# Patient Record
Sex: Female | Born: 1960 | Race: White | Hispanic: No | Marital: Married
Health system: Midwestern US, Academic
[De-identification: ages and names within clinical notes are randomized; demographics above are authoritative.]

---

## 2017-11-30 ENCOUNTER — Encounter: Admit: 2017-11-30 | Discharge: 2017-11-30 | Payer: MEDICARE

## 2017-11-30 DIAGNOSIS — N3020 Other chronic cystitis without hematuria: ICD-10-CM

## 2017-11-30 DIAGNOSIS — E2740 Unspecified adrenocortical insufficiency: ICD-10-CM

## 2017-11-30 DIAGNOSIS — R5383 Other fatigue: ICD-10-CM

## 2017-11-30 DIAGNOSIS — F329 Major depressive disorder, single episode, unspecified: ICD-10-CM

## 2017-11-30 DIAGNOSIS — E039 Hypothyroidism, unspecified: ICD-10-CM

## 2017-11-30 DIAGNOSIS — G43909 Migraine, unspecified, not intractable, without status migrainosus: ICD-10-CM

## 2017-11-30 DIAGNOSIS — F419 Anxiety disorder, unspecified: ICD-10-CM

## 2017-11-30 DIAGNOSIS — R000 Tachycardia, unspecified: ICD-10-CM

## 2017-11-30 DIAGNOSIS — F502 Bulimia nervosa: ICD-10-CM

## 2017-11-30 DIAGNOSIS — IMO0002 Degenerative disc disease: ICD-10-CM

## 2017-11-30 DIAGNOSIS — R51 Headache: ICD-10-CM

## 2017-11-30 DIAGNOSIS — K319 Disease of stomach and duodenum, unspecified: ICD-10-CM

## 2017-11-30 DIAGNOSIS — G4730 Sleep apnea, unspecified: ICD-10-CM

## 2017-11-30 DIAGNOSIS — F319 Bipolar disorder, unspecified: Principal | ICD-10-CM

## 2017-12-11 ENCOUNTER — Encounter: Admit: 2017-12-11 | Discharge: 2017-12-11 | Payer: MEDICARE

## 2017-12-12 MED ORDER — LEVOTHYROXINE 100 MCG PO TAB
ORAL_TABLET | Freq: Every day | 0 refills
Start: 2017-12-12 — End: ?

## 2018-04-09 ENCOUNTER — Encounter: Admit: 2018-04-09 | Discharge: 2018-04-09 | Payer: MEDICARE

## 2018-12-03 ENCOUNTER — Encounter: Admit: 2018-12-03 | Discharge: 2018-12-03 | Payer: MEDICARE
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
## 2019-01-04 ENCOUNTER — Encounter: Admit: 2019-01-04 | Discharge: 2019-01-04 | Payer: MEDICARE

## 2019-01-04 ENCOUNTER — Inpatient Hospital Stay
Admit: 2019-01-04 | Discharge: 2019-01-07 | Disposition: A | Discharge status: Home / Self Care | Payer: MEDICARE | Attending: Psychiatry | Admitting: Psychiatry

## 2019-01-04 DIAGNOSIS — R51 Headache: ICD-10-CM

## 2019-01-04 DIAGNOSIS — R000 Tachycardia, unspecified: ICD-10-CM

## 2019-01-04 DIAGNOSIS — F411 Generalized anxiety disorder: ICD-10-CM

## 2019-01-04 DIAGNOSIS — I2699 Other pulmonary embolism without acute cor pulmonale: ICD-10-CM

## 2019-01-04 DIAGNOSIS — E2740 Unspecified adrenocortical insufficiency: ICD-10-CM

## 2019-01-04 DIAGNOSIS — E039 Hypothyroidism, unspecified: ICD-10-CM

## 2019-01-04 DIAGNOSIS — G4730 Sleep apnea, unspecified: ICD-10-CM

## 2019-01-04 DIAGNOSIS — F502 Bulimia nervosa: ICD-10-CM

## 2019-01-04 DIAGNOSIS — K319 Disease of stomach and duodenum, unspecified: ICD-10-CM

## 2019-01-04 DIAGNOSIS — R5383 Other fatigue: ICD-10-CM

## 2019-01-04 DIAGNOSIS — IMO0002 Degenerative disc disease: ICD-10-CM

## 2019-01-04 DIAGNOSIS — G43909 Migraine, unspecified, not intractable, without status migrainosus: ICD-10-CM

## 2019-01-04 DIAGNOSIS — F329 Major depressive disorder, single episode, unspecified: ICD-10-CM

## 2019-01-04 DIAGNOSIS — F319 Bipolar disorder, unspecified: Principal | ICD-10-CM

## 2019-01-04 DIAGNOSIS — N3020 Other chronic cystitis without hematuria: ICD-10-CM

## 2019-01-04 DIAGNOSIS — F419 Anxiety disorder, unspecified: ICD-10-CM

## 2019-01-04 DIAGNOSIS — F314 Bipolar disorder, current episode depressed, severe, without psychotic features: ICD-10-CM

## 2019-01-04 LAB — CBC AND DIFF
Lab: 0 10*3/uL (ref 0–0.20)
Lab: 0.1 10*3/uL (ref 0–0.45)
Lab: 8.7 K/UL — ABNORMAL HIGH (ref 4.5–11.0)

## 2019-01-04 LAB — SALICYLATE LEVEL

## 2019-01-04 LAB — COMPREHENSIVE METABOLIC PANEL
Lab: 141 MMOL/L — ABNORMAL LOW (ref 137–147)
Lab: 38 U/L (ref 7–56)
Lab: 4.1 MMOL/L — ABNORMAL HIGH (ref 3.5–5.1)
Lab: 9 K/UL (ref 3–12)

## 2019-01-04 LAB — ALCOHOL LEVEL: Lab: <10 mg/dL (ref 70–100)

## 2019-01-04 LAB — ACETAMINOPHEN LEVEL: Lab: <10 ug/mL (ref <20.1)

## 2019-01-04 MED ORDER — MAGNESIUM SULFATE IN D5W 1 GRAM/100 ML IV PGBK
1 g | Freq: Once | INTRAVENOUS | 0 refills | Status: DC
Start: 2019-01-04 — End: 2019-01-05

## 2019-01-04 MED ORDER — ACETAMINOPHEN 500 MG PO TAB
1000 mg | Freq: Once | ORAL | 0 refills | Status: CP
Start: 2019-01-04 — End: ?
  Administered 2019-01-04: 1000 mg via ORAL

## 2019-01-04 MED ORDER — PSYLLIUM HUSK (WITH SUGAR) 3.4 GRAM PO PWPK
1 | Freq: Once | ORAL | 0 refills | Status: CP
Start: 2019-01-04 — End: ?
  Administered 2019-01-05: 06:00:00 3.4 g via ORAL

## 2019-01-04 MED ORDER — LACTATED RINGERS IV SOLP
1000 mL | INTRAVENOUS | 0 refills | Status: DC
Start: 2019-01-04 — End: 2019-01-05

## 2019-01-04 NOTE — Progress Notes
No Vitals per pt . Pt consented to video visit. Pt verified name and DOB. Pt stated she understands all the documents that were sent to her and has no questions at this time.

## 2019-01-04 NOTE — Patient Instructions
Please take your medicines as prescribed. If you have questions about your medications or treatment, please call the clinic during regular business hours. You may also call the pharmacy directly for refills.    In case of emergency, for example if you develop suicidal intents or homicidal intents, call 911.     Therapy:  Online:  -https://www.betterhelp.com  -https://www.talkspace.com    In-person:  -Call the number on the back of your insurance card to ask for a list of in-network providers  -[facility] (request a psychotherapy packet)  -[facility]  -Depending on your county of residence: [facility], [facility], [facility] [place], [facility], or [facility].  -[facility]  -visit the Psychology Today website and enter your ZIP code to find independently practicing or group practicing licensed clinical social workers or psychologists.    Intensive Outpatient Programs (sleep at home, but have intensive therapy a few times per week):  -[facility] [phone]  -[facility] [phone]    Managing Fears and Anxiety around Coronavirus:  Common reactions to uncertainty about Coronavirus range widely, but may include anxiety/worry, feelings of helplessness, social withdrawal, difficulty concentrating and sleeping, anger, or hyper-vigilance to your health and body.  To manage fears and anxiety, it is recommended that you:    Download the Covid Coach: https://www.mobile.va.gov/app/covid-coach      1. Stay informed using the CDC website at https://www.cdc.gov/coronavirus/      2. Limit worry by lessening the time spent watching or listening to upsetting media coverage.  Remember to take breaks from the news and focus upon things that are positive in your life and that you have control over.     3. Be mindful of your assumptions about others.  If someone coughs or has

## 2019-01-05 ENCOUNTER — Encounter: Admit: 2019-01-05 | Discharge: 2019-01-05 | Payer: MEDICARE

## 2019-01-05 DIAGNOSIS — F314 Bipolar disorder, current episode depressed, severe, without psychotic features: ICD-10-CM

## 2019-01-05 DIAGNOSIS — R51 Headache: ICD-10-CM

## 2019-01-05 DIAGNOSIS — F329 Major depressive disorder, single episode, unspecified: ICD-10-CM

## 2019-01-05 DIAGNOSIS — F419 Anxiety disorder, unspecified: ICD-10-CM

## 2019-01-05 DIAGNOSIS — E039 Hypothyroidism, unspecified: ICD-10-CM

## 2019-01-05 DIAGNOSIS — G4730 Sleep apnea, unspecified: ICD-10-CM

## 2019-01-05 DIAGNOSIS — R911 Solitary pulmonary nodule: ICD-10-CM

## 2019-01-05 DIAGNOSIS — N3020 Other chronic cystitis without hematuria: ICD-10-CM

## 2019-01-05 DIAGNOSIS — F502 Bulimia nervosa: ICD-10-CM

## 2019-01-05 DIAGNOSIS — G629 Polyneuropathy, unspecified: ICD-10-CM

## 2019-01-05 DIAGNOSIS — IMO0002 Degenerative disc disease: ICD-10-CM

## 2019-01-05 DIAGNOSIS — G43909 Migraine, unspecified, not intractable, without status migrainosus: ICD-10-CM

## 2019-01-05 DIAGNOSIS — M1990 Unspecified osteoarthritis, unspecified site: ICD-10-CM

## 2019-01-05 DIAGNOSIS — E2740 Unspecified adrenocortical insufficiency: ICD-10-CM

## 2019-01-05 DIAGNOSIS — I2699 Other pulmonary embolism without acute cor pulmonale: ICD-10-CM

## 2019-01-05 DIAGNOSIS — F411 Generalized anxiety disorder: ICD-10-CM

## 2019-01-05 DIAGNOSIS — K319 Disease of stomach and duodenum, unspecified: ICD-10-CM

## 2019-01-05 DIAGNOSIS — G2401 Drug induced subacute dyskinesia: ICD-10-CM

## 2019-01-05 DIAGNOSIS — F319 Bipolar disorder, unspecified: Principal | ICD-10-CM

## 2019-01-05 DIAGNOSIS — R000 Tachycardia, unspecified: ICD-10-CM

## 2019-01-05 DIAGNOSIS — R5383 Other fatigue: ICD-10-CM

## 2019-01-05 LAB — COCAINE-URINE RANDOM: Lab: NEGATIVE mg/dL (ref 0.3–1.2)

## 2019-01-05 LAB — CANNABINOIDS-URINE RANDOM: Lab: NEGATIVE g/dL — ABNORMAL HIGH (ref 6.0–8.0)

## 2019-01-05 LAB — AMPHETAMINES-URINE RANDOM: Lab: NEGATIVE mg/dL (ref 7–25)

## 2019-01-05 LAB — BENZODIAZEPINES-URINE RANDOM: Lab: NEGATIVE mg/dL (ref 8.5–10.6)

## 2019-01-05 LAB — BARBITURATES-URINE RANDOM: Lab: NEGATIVE mg/dL (ref 0.4–1.00)

## 2019-01-05 LAB — PHENCYCLIDINES-URINE RANDOM: Lab: NEGATIVE g/dL — ABNORMAL HIGH (ref 3.5–5.0)

## 2019-01-05 LAB — METHADONE-URINE SCREEN: Lab: NEGATIVE U/L — ABNORMAL HIGH (ref 25–110)

## 2019-01-05 LAB — OPIATES 300 OR GREATER-URINE RANDOM: Lab: NEGATIVE MMOL/L (ref 21–30)

## 2019-01-05 LAB — TSH WITH FREE T4 REFLEX: Lab: 1.3 uU/mL (ref 0.35–5.00)

## 2019-01-05 LAB — OXYCODONE URINE SCREEN: Lab: NEGATIVE U/L — ABNORMAL HIGH (ref 7–40)

## 2019-01-05 MED ORDER — LAMOTRIGINE 100 MG PO TAB
100 mg | Freq: Two times a day (BID) | ORAL | 0 refills | Status: DC
Start: 2019-01-05 — End: 2019-01-05
  Administered 2019-01-05: 13:00:00 100 mg via ORAL

## 2019-01-05 MED ORDER — METOPROLOL SUCCINATE 25 MG PO TB24
50 mg | Freq: Every day | ORAL | 0 refills | Status: DC
Start: 2019-01-05 — End: 2019-01-07
  Administered 2019-01-05 – 2019-01-07 (×3): 50 mg via ORAL

## 2019-01-05 MED ORDER — POTASSIUM CHLORIDE 10 MEQ PO TBTQ
10 meq | Freq: Every day | ORAL | 0 refills | Status: DC
Start: 2019-01-05 — End: 2019-01-07
  Administered 2019-01-06 – 2019-01-07 (×2): 10 meq via ORAL

## 2019-01-05 MED ORDER — LAMOTRIGINE 100 MG PO TAB
300 mg | Freq: Every evening | ORAL | 0 refills | Status: DC
Start: 2019-01-05 — End: 2019-01-07
  Administered 2019-01-06: 01:00:00 300 mg via ORAL
  Administered 2019-01-07: 02:00:00 200 mg via ORAL

## 2019-01-05 MED ORDER — ACETAMINOPHEN 325 MG PO TAB
650 mg | ORAL | 0 refills | Status: DC | PRN
Start: 2019-01-05 — End: 2019-01-07
  Administered 2019-01-05 – 2019-01-07 (×5): 650 mg via ORAL

## 2019-01-05 MED ORDER — FAMOTIDINE 20 MG PO TAB
20 mg | Freq: Two times a day (BID) | ORAL | 0 refills | Status: DC
Start: 2019-01-05 — End: 2019-01-07
  Administered 2019-01-06 – 2019-01-07 (×4): 20 mg via ORAL

## 2019-01-05 MED ORDER — NICOTINE (POLACRILEX) 2 MG BU LOZG
2 mg | ORAL | 0 refills | Status: DC | PRN
Start: 2019-01-05 — End: 2019-01-07

## 2019-01-05 MED ORDER — LEVOTHYROXINE 100 MCG PO TAB
100 ug | Freq: Every day | ORAL | 0 refills | Status: DC
Start: 2019-01-05 — End: 2019-01-07
  Administered 2019-01-05 – 2019-01-07 (×3): 100 ug via ORAL

## 2019-01-05 MED ORDER — CLONAZEPAM 1 MG PO TAB
1 mg | Freq: Three times a day (TID) | ORAL | 0 refills | Status: DC
Start: 2019-01-05 — End: 2019-01-07
  Administered 2019-01-05 – 2019-01-07 (×7): 1 mg via ORAL

## 2019-01-05 MED ORDER — FUROSEMIDE 20 MG PO TAB
40 mg | Freq: Every day | ORAL | 0 refills | Status: DC
Start: 2019-01-05 — End: 2019-01-07
  Administered 2019-01-05 – 2019-01-07 (×3): 40 mg via ORAL

## 2019-01-05 NOTE — Progress Notes
Case Management Progress Note    NAME:Stacy Scott MRN: [id number] DOB:November 07, 1960 AGE: 58 y.o.  ADMISSION DATE: 01/04/2019 DAYS ADMITTED: LOS: 0 days     Date of Service: 01/05/2019    Case Manager attempted to met with patient. Patient was engaged in tech time, patient was on her phone. Will attempt to complete initial work up.

## 2019-01-05 NOTE — ED Notes
Axis 3:  Degenerative disc disease, Hypothyroidism, Migraines, Hx of Pulmonary embolism, Adrenal Insufficieny, Bulimia    Axis 4:  Chronic mental Illness, poor cooping skills, relational problems with husband and son.    Disposition: To be admitted to adult psych     Reviewed: Dr.Jackson     Attending Physician:  Dr.Jackson

## 2019-01-05 NOTE — Consults
bilateral lower  extremiites 2ndry to back pain surgrer   ??? Pulmonary embolism (HCC)     a couple years ago   ??? Sleep apnea    ??? Sleep apnea    ??? Stomach disorder    ??? Tachycardia    ??? Tardive dyskinesia      Surgical History:   Procedure Laterality Date   ??? CHOLECYSTECTOMY  08/29/1982   ??? GASTRIC FUNDOPLICATION  2013   ??? Removal Implantable Loop Recorder N/A 08/25/2015    Performed by Miles Costain, MD at [facility]   ??? HX BACK SURGERY  2006,2010,2012,2013   ??? HX BACK SURGERY      Disc replacement 2006, 2010, 2012, 2013 and August 2017   ??? HX CHOLECYSTECTOMY     ??? HX HYSTERECTOMY     ??? LAPAROSCOPY     ??? NECK SURGERY      x 4     Social History     Socioeconomic History   ??? Marital status: Married     Spouse name: Not on file   ??? Number of children: 2   ??? Years of education: Not on file   ??? Highest education level: Not on file   Occupational History   ??? Not on file   Social Needs   ??? Financial resource strain: Not on file   ??? Food insecurity     Worry: Not on file     Inability: Not on file   ??? Transportation needs     Medical: Not on file     Non-medical: Not on file   Tobacco Use   ??? Smoking status: Former Smoker     Packs/day: 0.50     Years: 20.00     Pack years: 10.00     Types: Cigarettes     Last attempt to quit: 08/29/2005     Years since quitting: 13.3   ??? Smokeless tobacco: Never Used   Substance and Sexual Activity   ??? Alcohol use: Yes     Alcohol/week: 2.0 standard drinks     Types: 2 Cans of beer per week     Comment:  2 cans a week   ??? Drug use: No   ??? Sexual activity: Not on file   Lifestyle   ??? Physical activity     Days per week: Not on file     Minutes per session: Not on file   ??? Stress: Not on file   Relationships   ??? Social Wellsite geologist on phone: Not on file     Gets together: Not on file     Attends religious service: Not on file     Active member of club or organization: Not on file     Attends meetings of clubs or organizations: Not on file     Relationship status: Not on file Extremities: pulses palpable, no pedal edema or skin lesions   Neurologic: Grossly normal, CN 2-12 intact  Lymph nodes: Cervical, supraclavicular nodes normal.    Lab Review  24-hour labs:    Results for orders placed or performed during the hospital encounter of 01/04/19 (from the past 24 hour(s))   AMPHETAMINES-URINE RANDOM    Collection Time: 01/04/19  7:36 PM   Result Value Ref Range    Amphetamines NEG NEG-NEG   BARBITURATES-URINE RANDOM    Collection Time: 01/04/19  7:36 PM   Result Value Ref Range    Barbiturates,Urine NEG NEG-NEG  [facility] RANDOM    Collection Time: 01/04/19  7:36 PM   Result Value Ref Range    Benzodiazepines NEG NEG-NEG   CANNABINOIDS-URINE RANDOM    Collection Time: 01/04/19  7:36 PM   Result Value Ref Range    THC NEG NEG-NEG   COCAINE-URINE RANDOM    Collection Time: 01/04/19  7:36 PM   Result Value Ref Range    Cocaine-Urine NEG NEG-NEG   PHENCYCLIDINES-URINE RANDOM    Collection Time: 01/04/19  7:36 PM   Result Value Ref Range    Phencyclidine (PCP) NEG NEG-NEG   METHADONE-URINE SCREEN    Collection Time: 01/04/19  7:36 PM   Result Value Ref Range    Methadone NEG NEG-NEG   OXYCODONE URINE SCREEN    Collection Time: 01/04/19  7:36 PM   Result Value Ref Range    Oxycodone, Urine NEG NEG-NEG   OPIATES 300 OR GREATER-URINE RANDOM    Collection Time: 01/04/19  7:36 PM   Result Value Ref Range    Opiates 300 NEG NEG-NEG       Point of Care Testing  (Last 24 hours)  Glucose: 91 (01/04/19 1703)    Radiology and other Diagnostics Review:    Pertinent radiology reviewed.      Durenda Hurt, MD  Available on [facility] or C[employer] app under [facility], pager [phone]
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
## 2019-01-05 NOTE — Care Plan
Problem: Discharge Planning  Goal: Participation in plan of care  Outcome: Goal Ongoing  Flowsheets (Taken 01/05/2019 1008)  Participation in Plan of Care: Involve patient/caregiver in care planning decision making     Problem: Health Maintenance - Impaired  Goal: Able to perfom ADL's  Outcome: Goal Ongoing  Flowsheets (Taken 01/05/2019 1008)  Able to perform ADLs:   Assess for pain   Assess sleep/rest   Assess ADL ability     Problem: Mood - Altered  Goal: Stabilize mood  Outcome: Goal Ongoing  Flowsheets (Taken 01/05/2019 1008)  Stabilize mood:   Assess ineffective coping signs and symptoms   Assess coping style     Problem: Cognitive-Perceptual Pattern - Impaired  Goal: Knowledge of prescribed medication  Outcome: Goal Ongoing  Flowsheets (Taken 01/05/2019 1008)  Knowledge of prescribed medication:   Provide prescribed medication education   Adherence to medication schedule     Problem: Anxiety  Goal: Alleviation of anxiety  Outcome: Goal Ongoing  Flowsheets (Taken 01/05/2019 1008)  Alleviation of Anxiety:   Assess coping style   Provide coping support to patient/caregiver

## 2019-01-05 NOTE — Progress Notes
Patient care was transitioned from Dr. Jeraldine Loots with plans for patient to be admitted to psychiatry.  At the request of psychiatry, patient was reevaluated at this time for any evidence of tardive dyskinesia.  At this time of my evaluation the patient has no active symptoms of tardive dyskinesia.  Plan for patient to be admitted to inpatient psychiatry.    Hilliard Clark, MD

## 2019-01-05 NOTE — Progress Notes
Pt arrived on the unit at 0430 via wheel chair. Pt was quiet and tearful. Pt was shown to room and around unit. Pt was very tearful and emotional when talking about life and her at home situation. She rated her anxiety 10/10 and depression 3/10 but could not fully describe what she was feeling due to having a long overwhelming day. Denied SI/HI/AH/VH. Reports pain 7/10 in head, neck, and lower body as chronic. PRN med was given. Pt reported I just want to get help and get better again, because I dont know what to do anymore. Pt was given meds and told to try to get some rest before her day started and to try to relax. Pt was encouraged to reach out to staff for any needs. Will cont to monitor and document.             PRN Medication Administered:  0537 PO Tylenol 650mg   Non pharmacological interventions attempted prior to PRN medication administration:  Rest  distraction    Brief narrative of reason for PRN medication administration:     Per pt request for pain

## 2019-01-05 NOTE — Care Plan
Problem: Health Maintenance - Impaired  Goal: Adequate nutritional intake  Outcome: Goal Ongoing     Problem: Health Maintenance - Impaired  Goal: Knowledge of health maintenance  Outcome: Goal Ongoing     Problem: Mood - Altered  Goal: Stabilize mood  Outcome: Goal Ongoing     Problem: Thought Process - Altered  Goal: Demonstration of organized thought processes  Outcome: Goal Ongoing     Problem: Transition Readiness  Goal: Knowledge of transition readiness  Outcome: Goal Ongoing     Problem: Cognitive-Perceptual Pattern - Impaired  Goal: Demonstration of organized thought processes  Outcome: Goal Ongoing     Problem: Cognitive-Perceptual Pattern - Impaired  Goal: Knowledge of prescribed medication  Outcome: Goal Ongoing

## 2019-01-06 MED ORDER — PSYLLIUM HUSK (WITH SUGAR) 3.4 GRAM PO PWPK
1 | Freq: Two times a day (BID) | ORAL | 0 refills | Status: DC | PRN
Start: 2019-01-06 — End: 2019-01-07
  Administered 2019-01-06 – 2019-01-07 (×3): 3.4 g via ORAL

## 2019-01-06 NOTE — Progress Notes
[facility]  Therapy Progress Note    NAME:Stacy Scott MRN: [id number] DOB:1960/10/09 AGE: 58 y.o.  ADMISSION DATE: 01/04/2019 DAYS ADMITTED: LOS: 1 day    Date of Service:  01/06/19    Principal Problem:    Bipolar 1 disorder, depressed, severe (HCC)  Active Problems:    GAD (generalized anxiety disorder)    Borderline personality disorder (HCC)    Objective:  Stacy Scott will identify at least two coping skills leading to decrease in suicidal thoughts and include in safety plan.    Narrative:  Processing of earlier group therapy and topics related to her situation. Patient enjoyed hearing about prior provider's life and corresponding discussion in group. Provider encouraged specific applications to her life moving forward.    Follow up:  Patient to continue treatment.

## 2019-01-06 NOTE — Progress Notes
Pt alert and awake upon shift change. Presents with incongruent affect, fair eye contact, hyper verbal and circumstantial speech, fair hygiene. Pt denies all assessment questions. She reports 5/10 HA pain, no requests for PRN analgesic at this time. Pt reported sleep was okay as she was woken up at 1AM with headache, after tylenol she was able to sleep for 5 hours. She is visible in the milieu, attends and participates in groups with fair interactions. Appetite is good and adequate. Last reported BM 5/8 and shower 5/8; ordered PRN Metamucil per pt request. Will continue to engage in therapeutic interactions with pt, encourage pt to express feelings/thoughts to staff, Q15 minute safety checks.
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
## 2019-01-07 ENCOUNTER — Ambulatory Visit: Admit: 2019-01-04 | Discharge: 2019-01-04 | Payer: MEDICARE

## 2019-01-07 ENCOUNTER — Encounter: Admit: 2019-01-07 | Discharge: 2019-01-07 | Payer: MEDICARE

## 2019-01-07 DIAGNOSIS — R45851 Suicidal ideations: ICD-10-CM

## 2019-01-07 DIAGNOSIS — Z90710 Acquired absence of both cervix and uterus: ICD-10-CM

## 2019-01-07 DIAGNOSIS — F411 Generalized anxiety disorder: ICD-10-CM

## 2019-01-07 DIAGNOSIS — Z86711 Personal history of pulmonary embolism: ICD-10-CM

## 2019-01-07 DIAGNOSIS — Z9049 Acquired absence of other specified parts of digestive tract: ICD-10-CM

## 2019-01-07 DIAGNOSIS — Z87891 Personal history of nicotine dependence: ICD-10-CM

## 2019-01-07 DIAGNOSIS — F314 Bipolar disorder, current episode depressed, severe, without psychotic features: Principal | ICD-10-CM

## 2019-01-07 DIAGNOSIS — G4730 Sleep apnea, unspecified: ICD-10-CM

## 2019-01-07 DIAGNOSIS — E039 Hypothyroidism, unspecified: ICD-10-CM

## 2019-01-07 DIAGNOSIS — R002 Palpitations: ICD-10-CM

## 2019-01-07 DIAGNOSIS — I10 Essential (primary) hypertension: ICD-10-CM

## 2019-01-07 DIAGNOSIS — F603 Borderline personality disorder: Secondary | ICD-10-CM

## 2019-01-07 MED ORDER — LAMOTRIGINE 100 MG PO TAB
100 mg | ORAL_TABLET | Freq: Two times a day (BID) | ORAL | 0 refills | Status: DC
Start: 2019-01-07 — End: 2019-01-25

## 2019-01-07 MED ORDER — CLONAZEPAM 1 MG PO TAB
1 mg | ORAL_TABLET | Freq: Three times a day (TID) | ORAL | 0 refills | Status: AC
Start: 2019-01-07 — End: ?

## 2019-01-07 MED ORDER — LAMOTRIGINE 100 MG PO TAB
100 mg | ORAL_TABLET | Freq: Two times a day (BID) | ORAL | 0 refills | Status: DC
Start: 2019-01-07 — End: 2019-01-07
  Filled 2019-01-07: qty 60, 30d supply

## 2019-01-07 MED ORDER — AMITRIPTYLINE 25 MG PO TAB
25 mg | Freq: Every evening | ORAL | 0 refills | Status: DC | PRN
Start: 2019-01-07 — End: 2019-01-07

## 2019-01-07 MED ORDER — NICOTINE (POLACRILEX) 2 MG BU LOZG
0 refills | Status: DC | PRN
Start: 2019-01-07 — End: 2019-02-07

## 2019-01-07 MED ORDER — LAMOTRIGINE 100 MG PO TAB
100 mg | Freq: Two times a day (BID) | ORAL | 0 refills | Status: DC
Start: 2019-01-07 — End: 2019-01-07
  Administered 2019-01-07: 14:00:00 100 mg via ORAL

## 2019-01-07 NOTE — Care Plan
Problem: Discharge Planning  Goal: Participation in plan of care  Outcome: Goal Ongoing  Flowsheets (Taken 01/07/2019 0140)  Participation in Plan of Care: Involve patient/caregiver in care planning decision making     Problem: Health Maintenance - Impaired  Goal: Establish therapeutic relationships  Outcome: Goal Ongoing  Flowsheets (Taken 01/07/2019 0140)  Establish therapeutic relationships:   Encourage expressions of feelings   Involve patient in decision-making process   Assist patient in setting realistic expectations     Problem: Thought Process - Altered  Goal: Demonstration of organized thought processes  Outcome: Goal Ongoing  Flowsheets (Taken 01/07/2019 0140)  Demonstration of organized thought processes:   Assess risk for impaired cognition   Assess thought process   Provide environmental regulation - agitation   Assess in reality orientation     Problem: Cognitive-Perceptual Pattern - Impaired  Goal: Knowledge of prescribed medication  Outcome: Goal Ongoing  Flowsheets (Taken 01/07/2019 0140)  Knowledge of prescribed medication:   Provide prescribed medication education   Adherence to medication schedule     Problem: Pain  Goal: Management of pain  Outcome: Goal Ongoing  Flowsheets (Taken 01/07/2019 0140)  Management of pain:   Complete pain assessment scale according to age, condition and ability to understand.   In the patient who can fully report pain, assess pain characteristics.   Manage pain.   Assess pain control barriers.     Problem: Bowel Elimination, Impaired/Altered  Goal: Effective bowel elimination  Outcome: Goal Ongoing  Flowsheets (Taken 01/07/2019 0143)  Effective bowel elimination:   Assess bowel elimination   Provide constipation prevention education   Management of bowel elimination

## 2019-01-07 NOTE — Progress Notes
Name: Stacy Scott        MRN: [id number]          DOB: 12-28-1960          Age: 58 y.o.  Admission Date: 01/04/2019             LOS: 2 days        Discharge Note:    Condition    Patient's mood and though contents have improved and are appropriate for discharge.    Disposition    Patient is leaving with spouse via automobile to home.  All items from room, belonging bin, medication room, and safe are returned to the patients.    Discharge Instructions    Discharge instructions and psychiatry summary of care were discussed and provided. A copy will be transmitted to the next level of care provider within 24 hours after discharge. Patient/family expresses understanding. Patient denies SI/HI/AVH.
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
## 2019-01-07 NOTE — Progress Notes
Pt up and out in the dayroom, bright affect, good eye contact, hyperverbal. Pt states she has no anxiety or depression, no SI/HI, and no A/V. She reports that she's great, I'm doing great. She verbalized to this nurse that her husband is a great part of why she's here and that she had a good talk with him today. She would only take 200mg  of her Lamotrigine at HS and states she only takes 200 mg per day, but that she takes 100 mg BID. She is instructed to speak with her doctor tomorrow and this will be reported to day shift RN during report to be addressed. She reports having had difficulty sleeping last night and states she's felt her mood was slightly elevated and that maybe it's because I've gotten the wrong dose. She verbalized to RN several times that she doesn't want to change her medication dosages and that the doctor was aware. She requested PRN Metamucil with hs medications and later requested PRN Tylenol for back pain rated 5 and was asleep upon reassessment.     Pt is requested that her Tylenol be scheduled rather than PRN. She is reminded that it's available when she needs it and encouraged to speak with Dr in morning during rounds about possibility of scheduling doses.

## 2019-01-07 NOTE — Discharge Instructions - Appointments
Case Management Discharge Instructions:  Recommend outpatient follow up for psychiatric medication management, individual therapy and crisis services at [facility]  Appointment Information:  [facility] [phone]  Other Follow Up Information:  National Suicide Prevention Lifeline: 725-751-1516.  We can all help prevent suicide. The Lifeline provides 24/7, free and confidential support for people in distress, prevention and crisis resources for you or your loved ones, and best practices for professionals.

## 2019-01-07 NOTE — Discharge Instructions - Pharmacy
Take 200 mg by mouth at bedtime daily.  Commonly known as:  COLACE      famotidine 40 mg tablet  Dose:  40 mg  Take 40 mg by mouth daily.  Commonly known as:  PEPCID      furosemide 40 mg tablet  Dose:  40 mg  Take 40 mg by mouth twice daily before meals. 1 QAM & 1 @ NOON  Commonly known as:  LASIX      lamoTRIgine 100 mg tablet  Dose:  100 mg  Take one tablet by mouth twice daily.  What changed:    ??? when to take this  ??? Another medication with the same name was removed. Continue taking this medication, and follow the directions you see here.  Commonly known as:  LaMICtal      levothyroxine 100 mcg tablet  Dose:  100 mcg  Take 1 tablet by mouth daily 30 minutes before breakfast.  Commonly known as:  SYNTHROID      metoprolol XL 50 mg extended release tablet  Dose:  50 mg  Take 50 mg by mouth daily.  Commonly known as:  TOPROL XL      montelukast 10 mg tablet  Dose:  10 mg  Take 10 mg by mouth at bedtime daily.  Commonly known as:  SINGULAIR      potassium chloride 10 mEq capsule  Dose:  20 mEq  Take 20 mEq by mouth daily. Take with a meal and a full glass of water.   Indications: AM and Noon  Commonly known as:  MICRO-K   AM and Noon     psyllium 3.4 gram packet  Take  by mouth twice daily.  Commonly known as:  METAMUCIL      vitamins, B complex Tab  Dose:  1 tablet  Take 1 tablet by mouth daily.         Take these medications as needed      Indication   acyclovir 800 mg tablet  Dose:  800 mg  Take 800 mg by mouth daily as needed (mouth sores).  Commonly known as:  ZOVIRAX      methocarbamoL 500 mg tablet  Dose:  500 mg  Take 500 mg by mouth four times daily as needed for Spasms.  Commonly known as:  ROBAXIN      VENTOLIN HFA 90 mcg/actuation aerosol inhaler  ???:  Pharmacist may choose either generic albuterol HFA, ProAir HFA, Ventolin HFA, or Proventil HFA based on what is cheapest for the patient.  Dose:  2 puff  Inhale 2 puffs by mouth into the lungs every 6 hours as needed for

## 2019-01-07 NOTE — Care Plan
Problem: Discharge Planning  Goal: Participation in plan of care  Outcome: Goal Ongoing  Flowsheets (Taken 01/07/2019 1010)  Participation in Plan of Care: Involve patient/caregiver in care planning decision making     Problem: Mood - Altered  Goal: Stabilize mood  Outcome: Goal Ongoing  Flowsheets (Taken 01/07/2019 1010)  Stabilize mood:   Assess coping style   Establish therapeutic relationships   Assess depression history     Problem: Self-esteem - Low  Goal: Demonstration of positive self-esteem  Outcome: Goal Ongoing  Flowsheets (Taken 01/07/2019 1010)  Demonstration of positive self-esteem:   Provide problem-solving support   Provide positive reinforcement   Assessment of social function   Assess impaired social interaction signs and symptoms   Assess low self-esteem signs and symptoms     Problem: Transition Readiness  Goal: Knowledge of transition readiness  Outcome: Goal Ongoing  Flowsheets (Taken 01/07/2019 1010)  Knowledge of transition readiness:   Provide depression relapse prevention education   Provide treatment regimen education   Provide when to call provider education   Provide community resource education     Problem: Cognitive-Perceptual Pattern - Impaired  Goal: Knowledge of prescribed medication  Outcome: Goal Ongoing  Flowsheets (Taken 01/07/2019 1010)  Knowledge of prescribed medication:   Provide prescribed medication education   Adherence to medication schedule     Problem: Coping - Ineffective, Family  Goal: Effective Coping  Outcome: Goal Ongoing  Flowsheets (Taken 01/07/2019 1010)  Effective coping:   Assess family dynamic   Assess support system   Provide coping support   Provide family participation promotion  Goal: Knowledge of positive coping methods  Outcome: Goal Ongoing  Flowsheets (Taken 01/07/2019 1010)  Knowledge of positive coping methods:   Provide positive coping methods education   Provide disease process education   Provide family interaction skills education Problem: Social Interaction - Impaired  Goal: Increased Social Interaction  Outcome: Goal Ongoing  Flowsheets (Taken 01/07/2019 1010)  Increased social interaction:   Assess impaired social interaction signs and symptoms   Establish therapeutic relationships   Provide problem-solving support     Problem: Pain  Goal: Management of pain  Outcome: Goal Ongoing  Flowsheets (Taken 01/07/2019 1010)  Management of pain:   Complete pain assessment scale according to age, condition and ability to understand.   Manage pain.   Assess pain control barriers.     Problem: Anxiety  Goal: Alleviation of anxiety  Outcome: Goal Ongoing  Flowsheets (Taken 01/07/2019 1010)  Alleviation of Anxiety:   Assess coping style   Provide coping support to patient/caregiver     Problem: Bowel Elimination, Impaired/Altered  Goal: Effective bowel elimination  Outcome: Goal Ongoing  Flowsheets (Taken 01/07/2019 1010)  Effective bowel elimination:   Assess bowel elimination   Management of bowel elimination   Provide constipation prevention education

## 2019-01-08 ENCOUNTER — Encounter: Admit: 2019-01-08 | Discharge: 2019-01-08 | Payer: MEDICARE

## 2019-01-18 ENCOUNTER — Encounter: Admit: 2019-01-18 | Discharge: 2019-01-18 | Payer: MEDICARE

## 2019-01-18 NOTE — Telephone Encounter
No answer after three attempts.  Please update contact info at next visit.

## 2019-01-25 ENCOUNTER — Encounter: Admit: 2019-01-25 | Discharge: 2019-01-25 | Payer: MEDICARE

## 2019-01-25 MED ORDER — LAMOTRIGINE 100 MG PO TAB
300 mg | ORAL_TABLET | Freq: Every evening | ORAL | 2 refills | Status: DC
Start: 2019-01-25 — End: 2019-02-06

## 2019-01-25 NOTE — Telephone Encounter
Increasing lamotrigine to 300mg  qhs. Patient trialed on dose while inpatient at [facility], then decreased it, then increase it yesterday and is doing better on it.

## 2019-02-06 ENCOUNTER — Encounter: Admit: 2019-02-06 | Discharge: 2019-02-06

## 2019-02-06 MED ORDER — LAMOTRIGINE 100 MG PO TAB
350 mg | ORAL_TABLET | Freq: Every day | ORAL | 1 refills | Status: AC
Start: 2019-02-06 — End: ?

## 2019-02-06 NOTE — Telephone Encounter
Increase lamotrigine to 350mg  daily (take 150mg  in the morning and 200mg  in the evening).
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
## 2019-02-06 NOTE — Telephone Encounter
Left VM with callback number at 1:35pm. Requested return call to discuss provider instructions above.

## 2019-02-06 NOTE — Telephone Encounter
Patient notified she can take lamotrigine once or twice daily, but three times daily is unnecessary. Patient wants to try the increase in the Lamictal.  This writer asked what does she mean when she said she needs to be seen 2 hours daily patient states she was told she needs 2 hours daily of therapy when she was inpatient. Patient states she started seeing a therapist today and she will be seeing one once a week.Patient is safe. Patient states someone from her the crisis line will be coming to her house this evening.

## 2019-02-25 ENCOUNTER — Encounter: Admit: 2019-02-25 | Discharge: 2019-02-25

## 2019-02-25 NOTE — Telephone Encounter
Patient contacted clinic stating that she was needing to cancel her appointment on 02/27/19 with Dr. Mick Sell. Stated that she wasn't going to be around that day. Patient stated that she was divorcing her husband and would be moving to [place]. Stated that she believes that she will have to take less and less medication once they are divorced. Stated that she has been working with [employer] and they would be putting a referral in for her to see a provider at [facility]. Patient made comments of her husband was very selfish and he said that he would not be able to meet her needs. Patient was hopeful of moving, "starting a new life" and stated that she was going to be okay. Stated having 40+ years to make up for. Stated that she would be able to visit her mother on [address], [place]. Stated that her son lives in [place] and would be able to look after her. Encouraged patient to keep one last appointment since it was only 2 days away and telehealth so that the provider was aware of her situtation, but due to financial concerns patient didn't want to keep appointment. Stated before her husband would take care of all the finances, but now she was on her own for that. Patient requested that this writer let the provider. Advised this would be routed to them. Patient appreciative of this writer speaking with her.     Spent approximately 24 minutes on the phone with patient.

## 2021-06-09 ENCOUNTER — Ambulatory Visit: Admit: 2021-06-09 | Discharge: 2021-06-09 | Payer: MEDICARE

## 2021-06-09 ENCOUNTER — Encounter: Admit: 2021-06-09 | Discharge: 2021-06-09 | Payer: MEDICARE

## 2021-06-09 DIAGNOSIS — E039 Hypothyroidism, unspecified: Secondary | ICD-10-CM

## 2021-06-09 DIAGNOSIS — F319 Bipolar disorder, unspecified: Secondary | ICD-10-CM

## 2021-06-09 DIAGNOSIS — R911 Solitary pulmonary nodule: Secondary | ICD-10-CM

## 2021-06-09 DIAGNOSIS — N3020 Other chronic cystitis without hematuria: Secondary | ICD-10-CM

## 2021-06-09 DIAGNOSIS — R000 Tachycardia, unspecified: Secondary | ICD-10-CM

## 2021-06-09 DIAGNOSIS — M1990 Unspecified osteoarthritis, unspecified site: Secondary | ICD-10-CM

## 2021-06-09 DIAGNOSIS — F411 Generalized anxiety disorder: Secondary | ICD-10-CM

## 2021-06-09 DIAGNOSIS — M25551 Pain in right hip: Secondary | ICD-10-CM

## 2021-06-09 DIAGNOSIS — G43909 Migraine, unspecified, not intractable, without status migrainosus: Secondary | ICD-10-CM

## 2021-06-09 DIAGNOSIS — F419 Anxiety disorder, unspecified: Secondary | ICD-10-CM

## 2021-06-09 DIAGNOSIS — K319 Disease of stomach and duodenum, unspecified: Secondary | ICD-10-CM

## 2021-06-09 DIAGNOSIS — R519 Generalized headaches: Secondary | ICD-10-CM

## 2021-06-09 DIAGNOSIS — G629 Polyneuropathy, unspecified: Secondary | ICD-10-CM

## 2021-06-09 DIAGNOSIS — G4730 Sleep apnea, unspecified: Secondary | ICD-10-CM

## 2021-06-09 DIAGNOSIS — F32A Depression: Secondary | ICD-10-CM

## 2021-06-09 DIAGNOSIS — IMO0002 Degenerative disc disease: Secondary | ICD-10-CM

## 2021-06-09 DIAGNOSIS — F502 Bulimia nervosa: Secondary | ICD-10-CM

## 2021-06-09 DIAGNOSIS — G2401 Drug induced subacute dyskinesia: Secondary | ICD-10-CM

## 2021-06-09 DIAGNOSIS — E2740 Unspecified adrenocortical insufficiency: Secondary | ICD-10-CM

## 2021-06-09 DIAGNOSIS — R5383 Other fatigue: Secondary | ICD-10-CM

## 2021-06-09 DIAGNOSIS — F314 Bipolar disorder, current episode depressed, severe, without psychotic features: Secondary | ICD-10-CM

## 2021-06-09 DIAGNOSIS — I2699 Other pulmonary embolism without acute cor pulmonale: Secondary | ICD-10-CM

## 2021-06-09 NOTE — Progress Notes
Date of Service: 06/09/2021     Subjective:  History of Present Illness  Stacy Scott is a 60 y.o. female in clinic to have bilateral hips evaluated.  She has an extensive past medical and surgical history including bipolar 1 disorder, severe anxiety, depression, eating disorder, tardive dyskinesia, history of 2 suicide attempts, most recently nearly 2 years ago which she was hospitalized for an extended duration for.  She also has chronic neck and low back pain related to degenerative disc disease s/p spinal fixation at 2 levels as well as cervical fixation from 2006 is the earliest surgery in 2017 is the most recent.  She also has a history of a brain tumor when she was younger.  She is unable to take NSAIDs due to gastrointestinal upset.  She has had adverse effects to oral steroids as well as back injections with steroids in the past, and would like to avoid these if possible.  She takes Tylenol daily which does not do much for her and she is over it.  She has pain in her hips when turning her toes in and out as well as with climbing stairs, sometimes having to crawl to avoid worsening pain.  She has seen a chiropractor in the past which gets her walking and is helpful, however this individual lives a distance away and she is unable to see them now.  She has done physical therapy for various things in the past, but never specifically for her hips.  Her hip pain has been ongoing for at least a year now, but over the last year it has seemingly worsened, noting that sitting bothers her quite a bit.  She believes she is lost 2 inches of height in the last few years, and believes her left leg is shorter than her right, and if she walks on a slope with her right leg down seems to help with the symptoms.  She denies numbness, tingling, weakness of her lower extremities.    Objective:         ? acetaminophen (TYLENOL) 500 mg tablet Take 500 mg by mouth every 6 hours as needed for Pain. Max of 4,000 mg of acetaminophen in 24 hours.   ? acyclovir (ZOVIRAX) 800 mg tablet Take 800 mg by mouth daily as needed (mouth sores).   ? calcium carbonate/vitamin D-3 (OSCAL-500+D) 1250 mg/200 unit tablet Take 1 tablet by mouth daily. Calcium Carb 1250mg  delivers 500mg  elemental Ca   ? clonazePAM (KLONOPIN) 1 mg tablet Take one tablet by mouth three times daily.   ? diazePAM (VALIUM) 5 mg tablet Take 5 mg by mouth every 8 hours.   ? divalproex (DEPAKOTE) 500 mg tablet, delayed release    ? famotidine (PEPCID) 20 mg tablet Take 20 mg by mouth twice daily.   ? furosemide (LASIX) 40 mg tablet Take 40 mg by mouth every morning. 1 QAM & 1 @ NOON   ? hydrOXYzine (ATARAX) 10 mg tablet Take 10 mg by mouth three times daily as needed for Itching.   ? ipratropium bromide (ATROVENT) 42 mcg (0.06 %) nasal spray Apply 2 sprays to each nostril as directed. TID-QID   ? lamoTRIgine (LAMICTAL) 100 mg tablet Take 3.5 tablets by mouth daily. Take 150mg  in the morning and 200mg  in the evening.   ? levothyroxine (SYNTHROID) 100 mcg tablet Take 1 tablet by mouth daily 30 minutes before breakfast.   ? metoprolol XL (TOPROL XL) 50 mg extended release tablet Take 50 mg by mouth daily.   ?  MULTIVITAMIN PO Take 1 tablet by mouth daily.   ? pantoprazole DR (PROTONIX) 20 mg tablet Take 20 mg by mouth daily.   ? potassium chloride(+) (MICRO-K) 10 mEq capsule Take 20 mEq by mouth daily. Take with a meal and a full glass of water.    ? psyllium (METAMUCIL) 3.4 gram packet Take  by mouth twice daily.   ? traZODone (DESYREL) 100 mg tablet    ? vitamins, B complex tab Take 1 tablet by mouth daily.     Vitals:    06/09/21 0928   BP: 136/84   Pulse: 60   Resp: 18   SpO2: 98%   PainSc: Five   Weight: 86.2 kg (190 lb)   Height: 160 cm (5' 3)     Body mass index is 33.66 kg/m?Marland Kitchen   Physical Exam  General: No acute distress, cooperative, conversive  HEENT: atraumatic, normocephalic  CV: good perfusion of extremities  Lungs: Nonlabored respirations with symmetrical chest expansion  Abdomen: Nondistended  Skin: no rash, no appreciable cyanosis  Psych: Appropriate affect  MSK:     Bilateral hips examined  Patient ambulates with difficulty, not favoring one side over the other  Has difficulty with both flexion and extension at the waist, however is able to walk on her toes as well as on her heels without significant difficulty  Limited active range of motion in both flexion and extension at her hips with 4+/5 strength and no pain  Full range of motion in internal and external rotation of both hips, but does have pain with anterior impingement maneuvers that localizes to the anterior groin on each side  Positive Pearlean Brownie with groin pain bilaterally  Positive FADIR bilaterally  Scour test negative bilaterally  Negative Stinchfield bilaterally with 4/5 strength  Negative lateral impingement bilaterally  4/5 strength with resisted adduction bilaterally but no pain  Tender to palpation over the AIIS, proximal anterior thigh musculature, as well as the left gluteal soft tissues as they insert into the greater trochanter  Sensation to light touch intact over the entirety of the right and left lower extremities  2+/4 DP pulses with brisk capillary refill bilaterally    Assessment and Plan:  1. Bilateral hip pain  Chronic, uncontrolled/not at goal  -X-ray bilateral hips x3 views with 1 AP of the pelvis performed this date independently interpreted showing minimal to no degenerative changes of the femoral acetabular joints, good sacroiliac joint spacing with minimal degenerative changes, prior lower lumbar implants, no acute osseous abnormality  - Discussed with patient that the site of her pain bilaterally is consistent with intra-articular pathology, however x-rays do not support this definitively  - Discussed that physical therapy for the next 6 weeks to try and focus on building strength in her pelvic girdle, lateral hip stabilizers, as well as in her core could be helpful to improve her functional ability and decrease her symptoms of pain-patient is amenable to this  - We will also consider diagnostic injections into both of her hips if the pain persists  - She has follow-up with me in 6 weeks or sooner as needed and continue to take Tylenol over-the-counter as needed  - HIPS MIN 5 VIEWS W PELVIS BILAT; Future  - AMB REFERRAL TO PHYSICAL THERAPY    No follow-ups on file.     Patient Instructions   1.  Activity modification: Allow pain to be your guide. Discontinue specific movements if they increase perceived pain. Rest if needed.   2.  Medication:  acetaminophen (Tylenol) as needed.   3.  Therapy: Physical therapy as prescribed. Your therapist will perform an evaluation and treat accordingly.   4.  Intervention: None  5.  Diagnostics: None  6.  Follow up: 6 weeks    If you are signed up for MyChart (a secure internal messaging system), you can message me with questions or concerns. If you need assistance signing up for MyChart, please contact us.    For follow-up appointments, please call the appointment line [phone] and specify at which location you would like to be seen. If you need to fax something, please use [phone].    With questions regarding care, please call Aundra Millet, ATC at [phone].    No future appointments.    [facility] - Primary Care (Monday & Thursday 8-4:30)  [address]  [place], [place] [address]    [facility] - Primary Care [phone] & Friday 1-4:30)  [address] [place], [address]    [facility] - Specialist [phone] 8-4:30 & Friday 8-11:30)  [address]  [place], [place] [address]    Chilton Greathouse, DO, [facility]  Family Medicine & Sports Medicine  [facility] Team Physician

## 2021-06-09 NOTE — Patient Instructions
1.  Activity modification: Allow pain to be your guide. Discontinue specific movements if they increase perceived pain. Rest if needed.   2.  Medication: acetaminophen (Tylenol) as needed.   3.  Therapy: Physical therapy as prescribed. Your therapist will perform an evaluation and treat accordingly.   4.  Intervention: None  5.  Diagnostics: None  6.  Follow up: 6 weeks    If you are signed up for MyChart (a secure internal messaging system), you can message me with questions or concerns. If you need assistance signing up for MyChart, please contact us.    For follow-up appointments, please call the appointment line [phone] and specify at which location you would like to be seen. If you need to fax something, please use [phone].    With questions regarding care, please call Aundra Millet, ATC at [phone].    No future appointments.    [facility] - Primary Care (Monday & Thursday 8-4:30)  [address]  [place], [place] [address]    [facility] - Primary Care [phone] & Friday 1-4:30)  [address] [place], [address]    [facility] - Specialist [phone] 8-4:30 & Friday 8-11:30)  [address]  [place], [place] [address]    Chilton Greathouse, DO, [facility]  Family Medicine & Sports Medicine  [facility] Team Physician

## 2021-07-21 ENCOUNTER — Ambulatory Visit: Admit: 2021-07-21 | Discharge: 2021-07-21 | Payer: MEDICARE

## 2021-07-21 ENCOUNTER — Encounter: Admit: 2021-07-21 | Discharge: 2021-07-21 | Payer: MEDICARE

## 2021-07-21 DIAGNOSIS — S73192A Other sprain of left hip, initial encounter: Secondary | ICD-10-CM

## 2021-07-21 DIAGNOSIS — M25552 Pain in left hip: Secondary | ICD-10-CM

## 2021-07-21 NOTE — Patient Instructions
1.  Activity modification: Allow pain to be your guide. Discontinue specific movements if they increase perceived pain. Rest if needed.   2.  Medication: NSAIDs (eg ibuprofen, naproxen, etc) and acetaminophen (Tylenol) as needed.   3.  Therapy: Physical therapy as prescribed. Your therapist will perform an evaluation and treat accordingly.   4.  Intervention: Continue Physical Therapy  5.  Diagnostics: MR Arthrogram of the left hip was ordered today  6.  Follow up: After MRI for results    If you are signed up for MyChart (a secure internal messaging system), you can message me with questions or concerns. If you need assistance signing up for MyChart, please contact us.    For follow-up appointments, please call the appointment line [phone] and specify at which location you would like to be seen. If you need to fax something, please use [phone].    With questions regarding care, please call Aundra Millet, ATC at [phone].    No future appointments.     [facility] - Primary Care (Monday & Thursday 8-4:30)  [address]  [place], [place] [address]    [facility] - Primary Care [phone] & Friday 1-4:30)  [address] [place], [address]    [facility] - Specialist [phone] 8-4:30 & Friday 8-11:30)  [address]  [place], [place] [address]    Chilton Greathouse, DO, [facility]  Family Medicine & Sports Medicine  [facility] Team Physician

## 2021-07-21 NOTE — Progress Notes
Date of Service: 07/21/2021     Subjective:  History of Present Illness  Stacy Scott is a 60 y.o. female in clinic to follow-up on hip pain.  She has been going to physical therapy for both hips since last time we saw her on 06/09/2021 about 6 weeks ago.  She has had noticeable improvements in strength as well as her function, making it easier to get in and out of cars.  She says her pain is maybe 40% better, going from a 10 down to a 6.  The left side is still worse than the right.  Tylenol is not helping much.  She finds it there is some exercises and PT she cannot do, for instance laying on her side trying to strengthen her gluteal musculature as this causes sagging in her spine which is bothersome.  The specific pain that she feels on a day-to-day basis depends on what she is doing.  She is concerned that she might have a shorter leg on one side as compared to the other.  She notes the pain in her hips is present both anteriorly as well as sometimes in her upper but on bilateral sides.  She denies numbness, tingling, or worsening weakness.  She cannot take NSAIDs due to history of gastric disease.  She does have a significant history of spinal surgery, with cervical fusion as well as a lumbar fusion of multiple levels, the records of which are unobtainable at this time.  She is concerned that her hip pain may be attributable to her back surgery.  She recently had a steroid injection in her left knee which did help with the pain there.  It has been quite sometime since she had any injections into her spine.    Objective:         ? acetaminophen (TYLENOL) 500 mg tablet Take 500 mg by mouth every 6 hours as needed for Pain. Max of 4,000 mg of acetaminophen in 24 hours.   ? acyclovir (ZOVIRAX) 800 mg tablet Take 800 mg by mouth daily as needed (mouth sores).   ? calcium carbonate/vitamin D-3 (OSCAL-500+D) 1250 mg/200 unit tablet Take 1 tablet by mouth daily. Calcium Carb 1250mg  delivers 500mg  elemental Ca   ? clonazePAM (KLONOPIN) 1 mg tablet Take one tablet by mouth three times daily.   ? diazePAM (VALIUM) 5 mg tablet Take 5 mg by mouth every 8 hours.   ? divalproex (DEPAKOTE) 500 mg tablet, delayed release    ? famotidine (PEPCID) 20 mg tablet Take 20 mg by mouth twice daily.   ? furosemide (LASIX) 40 mg tablet Take 40 mg by mouth every morning. 1 QAM & 1 @ NOON   ? hydrOXYzine (ATARAX) 10 mg tablet Take 10 mg by mouth three times daily as needed for Itching.   ? ipratropium bromide (ATROVENT) 42 mcg (0.06 %) nasal spray Apply 2 sprays to each nostril as directed. TID-QID   ? lamoTRIgine (LAMICTAL) 100 mg tablet Take 3.5 tablets by mouth daily. Take 150mg  in the morning and 200mg  in the evening.   ? levothyroxine (SYNTHROID) 100 mcg tablet Take 1 tablet by mouth daily 30 minutes before breakfast.   ? metoprolol XL (TOPROL XL) 50 mg extended release tablet Take 50 mg by mouth daily.   ? MULTIVITAMIN PO Take 1 tablet by mouth daily.   ? pantoprazole DR (PROTONIX) 20 mg tablet Take 20 mg by mouth daily.   ? potassium chloride(+) (MICRO-K) 10 mEq capsule Take 20 mEq by  mouth daily. Take with a meal and a full glass of water.    ? psyllium (METAMUCIL) 3.4 gram packet Take  by mouth twice daily.   ? traZODone (DESYREL) 100 mg tablet    ? vitamins, B complex tab Take 1 tablet by mouth daily.     Vitals:    07/21/21 0957   BP: 116/68   Pulse: 60   Temp: 37.1 ?C (98.7 ?F)   Resp: 18   SpO2: 98%   TempSrc: Oral   PainSc: Zero   Weight: 76.7 kg (169 lb)   Height: 160 cm (5' 3)     Body mass index is 29.94 kg/m?Marland Kitchen   Physical Exam   General: No acute distress,?cooperative, conversive  HEENT:?atraumatic, normocephalic  CV:?good perfusion of extremities  Lungs:?Nonlabored respirations with symmetrical chest expansion  Abdomen:?Nondistended  Skin: no rash, no appreciable cyanosis  Psych: Appropriate affect  MSK:   ?  Bilateral hips examined  Patient ambulates with difficulty, not favoring one side over the other  Limited active range of motion in both flexion and extension at her hips with 4+/5 strength and some pain on resisted hip flexion pain  Has 5/5 strength in resisted knee flexion and extension without pain  Full range of motion in internal and external rotation of both hips, but does have pain with anterior impingement maneuvers that localizes to the anterior groin on each side  Positive Faber with groin pain bilaterally  Positive FADIR bilaterally with pain in her SI joint as well  Scour test negative bilaterally  Negative Stinchfield bilaterally with 5/5 strength  Negative lateral impingement on the right, positive on the left  5/5 strength with resisted adduction bilaterally and no pain  Nontender to palpation over the AIIS today, nontender to palpation over the proximal anterior thigh musculature, but does have some tenderness to palpation over the greater trochanter soft tissue  Sensation to light touch intact over the entirety of the right and left lower extremities  2+/4 DP pulses with brisk capillary refill bilaterally      Assessment and Plan:  1. Chronic left hip pain  Chronic, exacerbated/not at goal  - Discussed with patient that her pain certainly may be coming from her back, however she has some maneuvers on physical exam as well as the location of her pain with active movements that suggest there may be some intra-articular pathology  - Discussed that we can do a diagnostic hip injection as well as investigate further with an MRI arthrogram if there are any intra-articular abnormality such as labral tear or further degenerative disease of the hip that is not seen on plain radiographs  - She wishes to avoid the diagnostic injection at this time and to gather more information  - We will potentially perform diagnostic hip injection in the future, however it may be that she needs to see nonoperative spine team for further evaluation/management of her back pain  - FLUORO GUIDED INJ  FOR CT/MR HIP LT; Future  - MRI LOWER EXT JNT W CONT LEFT; Future    2. Tear of left acetabular labrum, initial encounter  Plan as above  - FLUORO GUIDED INJ  FOR CT/MR HIP LT; Future  - MRI LOWER EXT JNT W CONT LEFT; Future    No follow-ups on file.     Patient Instructions   1.  Activity modification: Allow pain to be your guide. Discontinue specific movements if they increase perceived pain. Rest if needed.   2.  Medication: NSAIDs (  eg ibuprofen, naproxen, etc) and acetaminophen (Tylenol) as needed.   3.  Therapy: Physical therapy as prescribed. Your therapist will perform an evaluation and treat accordingly.   4.  Intervention: Continue Physical Therapy  5.  Diagnostics: MR Arthrogram of the left hip was ordered today  6.  Follow up: After MRI for results    If you are signed up for MyChart (a secure internal messaging system), you can message me with questions or concerns. If you need assistance signing up for MyChart, please contact us.    For follow-up appointments, please call the appointment line [phone] and specify at which location you would like to be seen. If you need to fax something, please use [phone].    With questions regarding care, please call Aundra Millet, ATC at [phone].    No future appointments.    [facility] - Primary Care (Monday & Thursday 8-4:30)  [address]  [place], [place] [address]    [facility] - Primary Care [phone] & Friday 1-4:30)  [address] [place], [address]    [facility] Sports Medicine Clinic - Specialist [phone] 8-4:30 & Friday 8-11:30)  [address]  [place], [place] [address]    Chilton Greathouse, DO, [facility]  Family Medicine & Sports Medicine  [facility] Team Physician

## 2021-07-26 ENCOUNTER — Encounter: Admit: 2021-07-26 | Discharge: 2021-07-26 | Payer: MEDICARE

## 2021-07-26 DIAGNOSIS — M25552 Pain in left hip: Secondary | ICD-10-CM

## 2021-07-26 DIAGNOSIS — S73192A Other sprain of left hip, initial encounter: Secondary | ICD-10-CM

## 2021-08-05 ENCOUNTER — Encounter: Admit: 2021-08-05 | Discharge: 2021-08-05 | Payer: MEDICARE

## 2021-08-05 DIAGNOSIS — S73192A Other sprain of left hip, initial encounter: Secondary | ICD-10-CM

## 2021-08-25 ENCOUNTER — Encounter: Admit: 2021-08-25 | Discharge: 2021-08-25 | Payer: MEDICARE

## 2021-08-25 DIAGNOSIS — R69 Illness, unspecified: Secondary | ICD-10-CM

## 2021-09-09 IMAGING — MR MR cervical spine wo con
4 of 6 series · 23 of 48 positions shown · non-contrast
Comparison: none

MRI cervical spine without contrast
HISTORY: Neck pain.
TECHNIQUE: Multiplanar images of the cervical spine were obtained without contrast according to standard protocol.

[Series 101: survey · axial · 10.0mm · 1.17mm/px · z∈[-15,+149]mm · 2 of 9 slices shown]
[im 1/9]
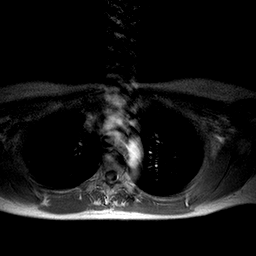
[im 9/9]
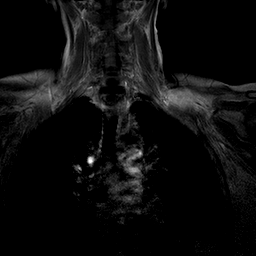

[Series 302: et2 sag · sagittal · 3.0mm · 0.39mm/px · 7 of 34 slices shown]
[im 1/34]
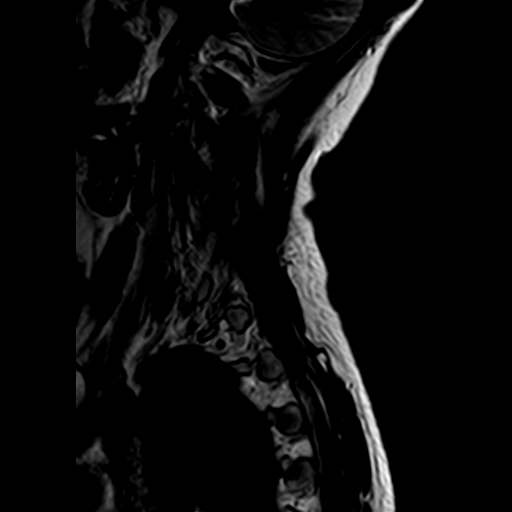
[im 5/34]
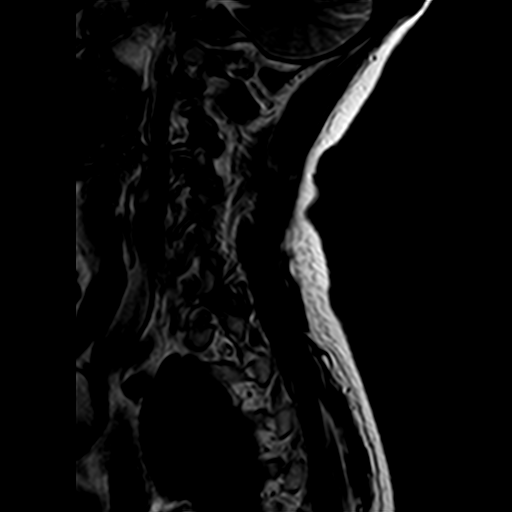
[im 10/34]
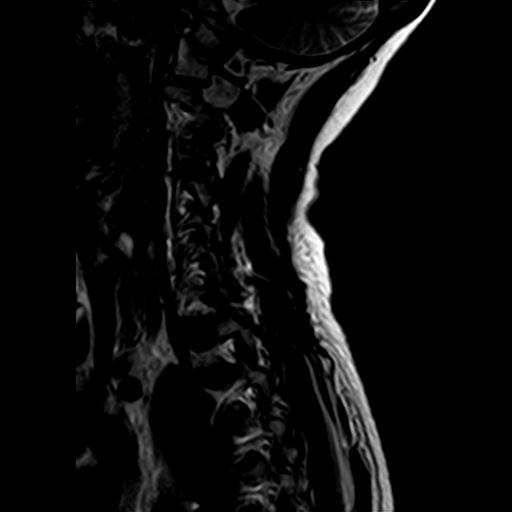
[im 15/34]
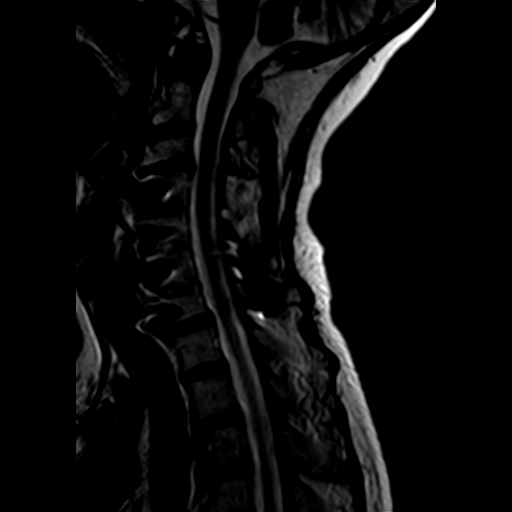
[im 19/34]
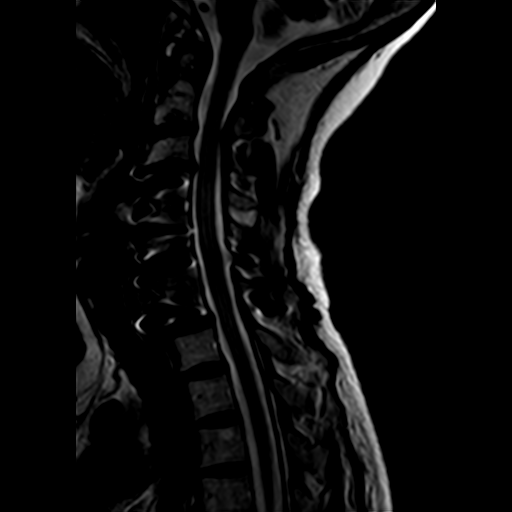
[im 24/34]
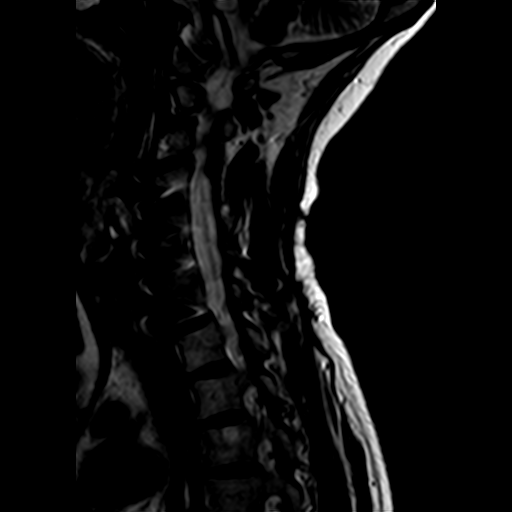
[im 29/34]
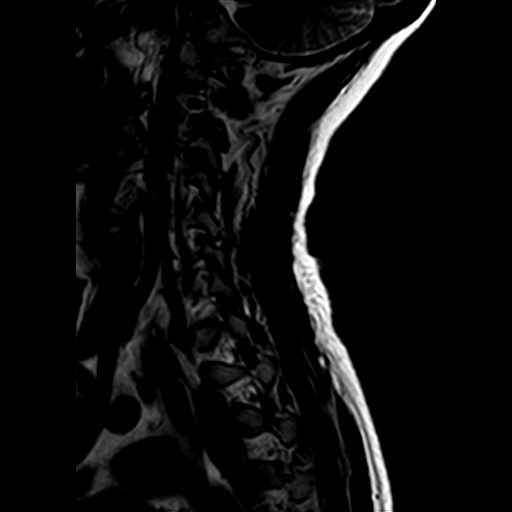

[Series 501: STIR · sagittal · 3.0mm · 0.50mm/px · 5 of 23 slices shown]
[im 1/23]
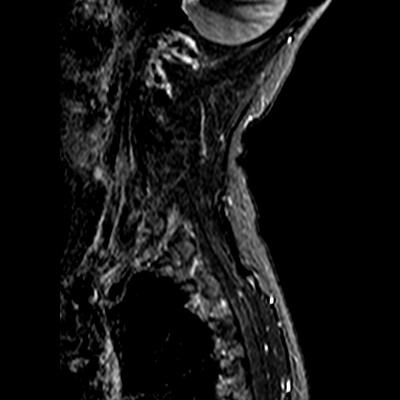
[im 6/23]
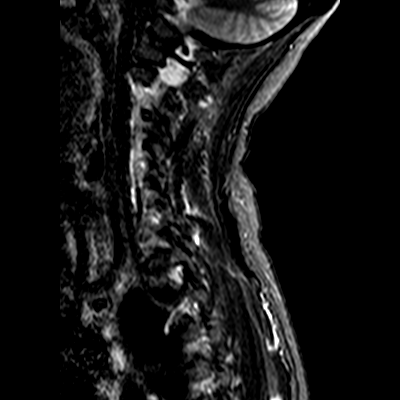
[im 12/23]
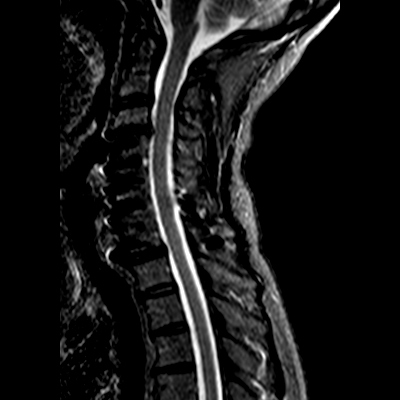
[im 17/23]
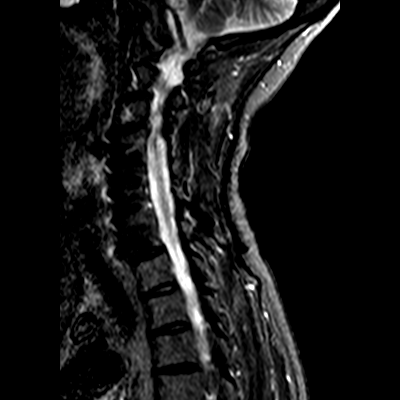
[im 23/23]
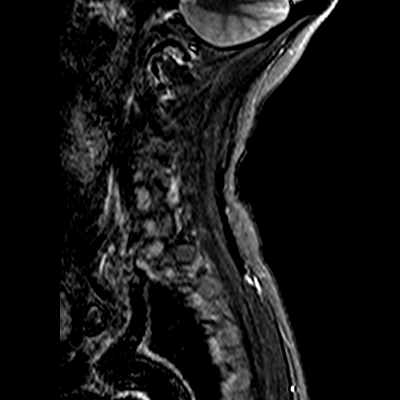

[Series 701: T2 · axial · 3.0mm · 0.39mm/px · z∈[-6,+109]mm · 9 of 38 slices shown]
[im 1/38]
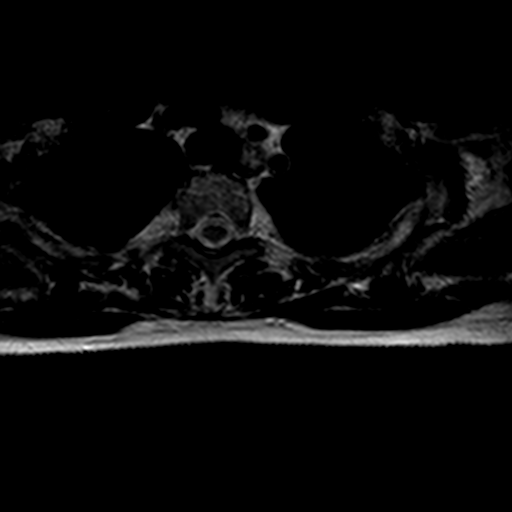
[im 5/38]
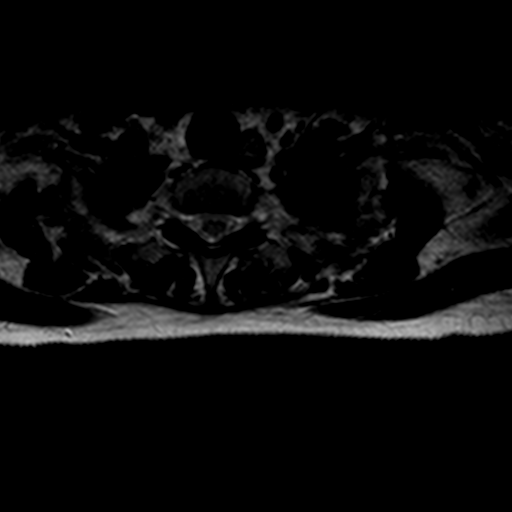
[im 10/38]
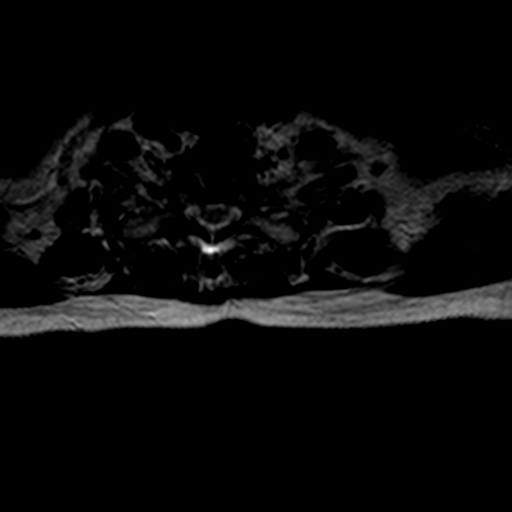
[im 14/38]
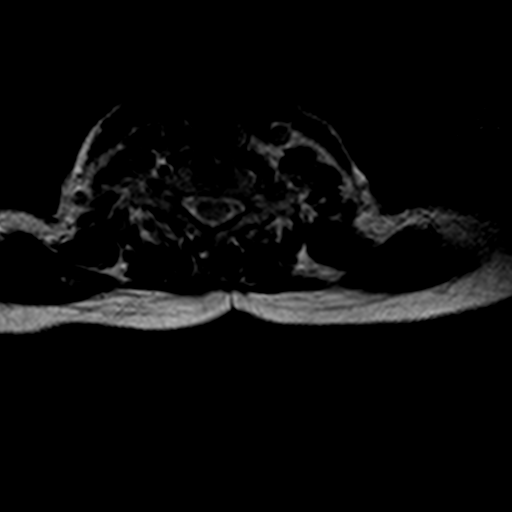
[im 19/38]
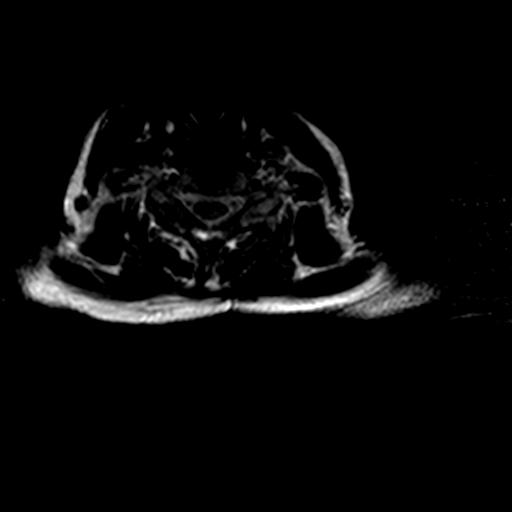
[im 24/38]
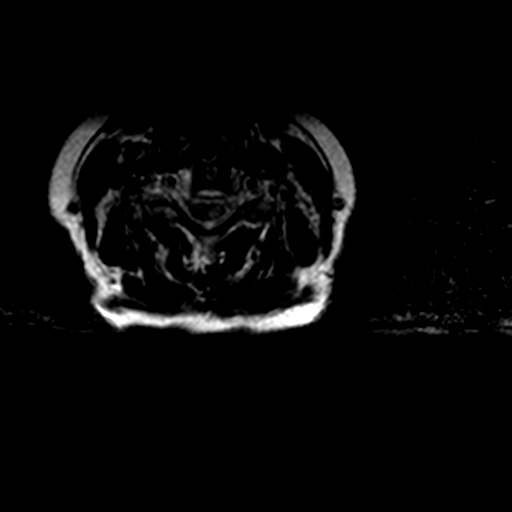
[im 28/38]
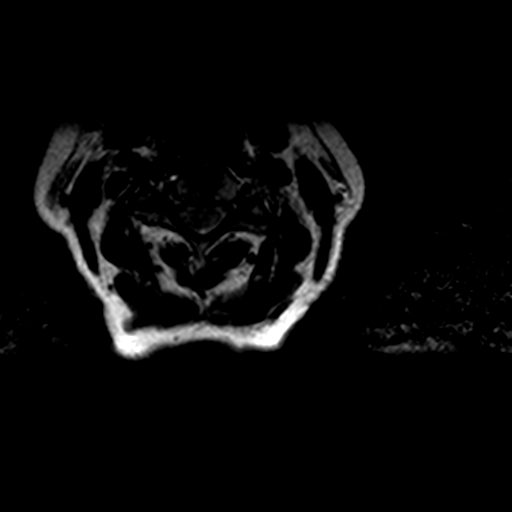
[im 33/38]
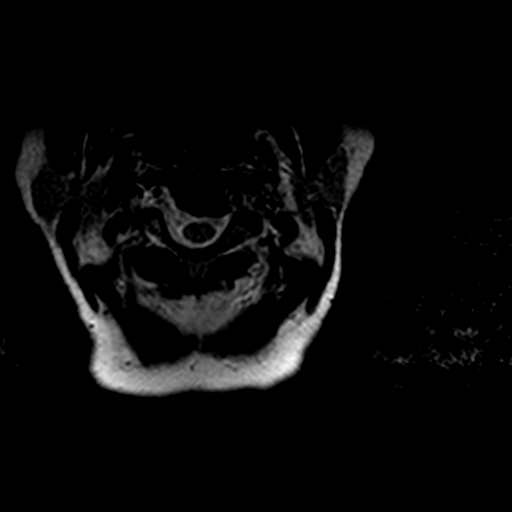
[im 38/38]
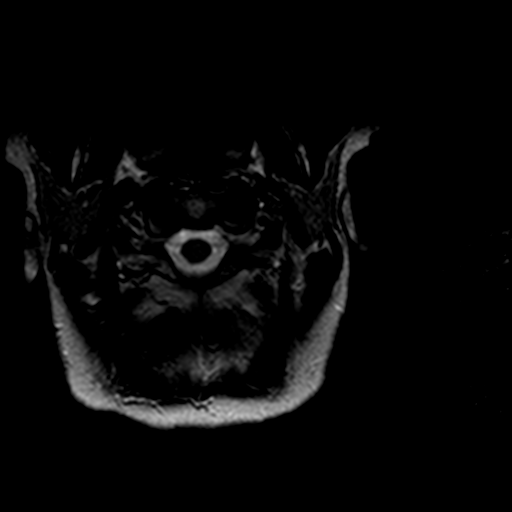

[23 of 48 positions shown; findings below may reference images not displayed]

FINDINGS: Anterior cervical fusion is present between C4 and C7 with intervertebral fusion noted.
The spinal cord demonstrates normal signal intensity throughout.  The imaged portion of brainstem and cerebellum are normal.
No paravertebral soft tissue abnormality present.
At C3-C4 there is mild posterior disc bulge present with mild central canal stenosis.  No significant foraminal stenosis noted.  There is also minimal posterior disc bulge at C7-T1 without central canal or neural foraminal stenosis.
IMPRESSION: 1. Postoperative changes with anterior cervical fusion between C4 and C7.
2. Mild spondylosis as described above with mild stenosis at C3-C4.

## 2022-02-18 IMAGING — MR MR cervical spine wo con
6 series · 40 of 48 positions shown · non-contrast
Comparison: 09 September, 2021.

SPCERVWO
REASON FOR EXAM: Neck pain history of surgery
TECHNIQUE: Multiplanar, multisequence MRI of the cervical spine was obtained without contrast

[Series 101: survey · axial · 10.0mm · 0.94mm/px · z∈[-15,+150]mm · 4 of 9 slices shown (1 of 2)]
[im 1/9]
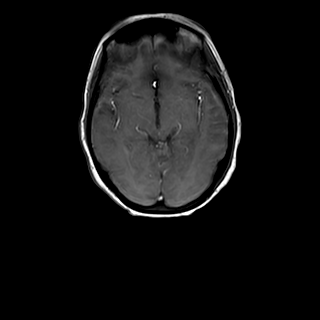
[im 3/9]
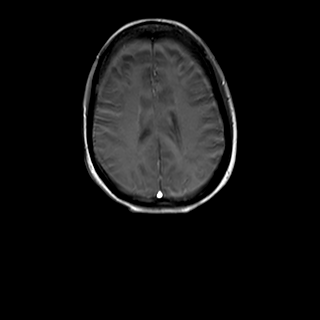
[im 6/9]
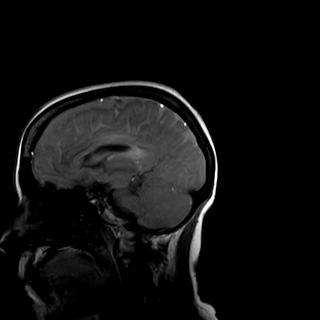
[im 9/9]
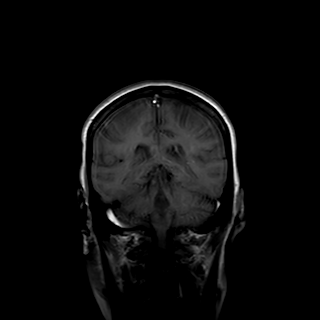

[Series 201: survey · axial · 10.0mm · 0.94mm/px · z∈[-133,-103]mm · 2 of 9 slices shown (2 of 2)]
[im 1/9]
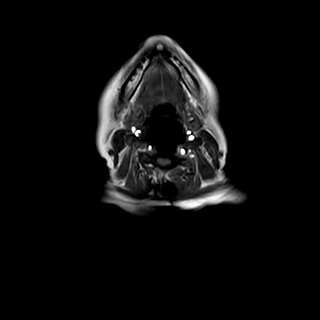
[im 3/9]
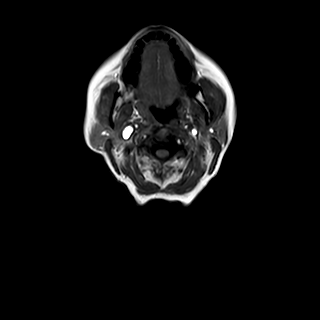

[Series 301: T2 · sagittal · 3.0mm · 0.55mm/px · 8 of 17 slices shown (1 of 2)]
[im 1/17]
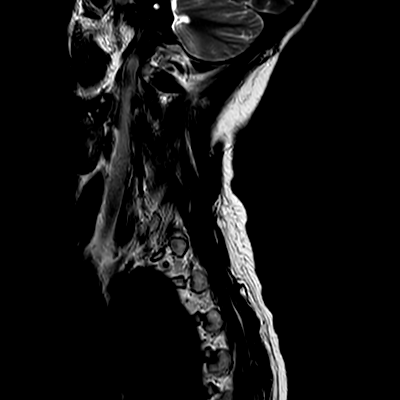
[im 3/17]
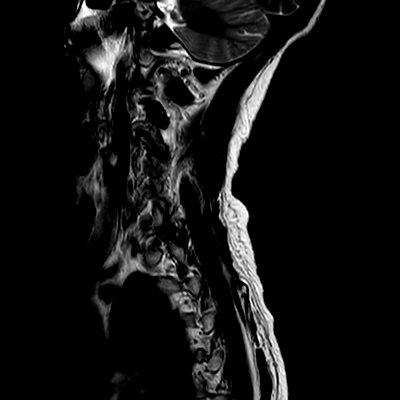
[im 5/17]
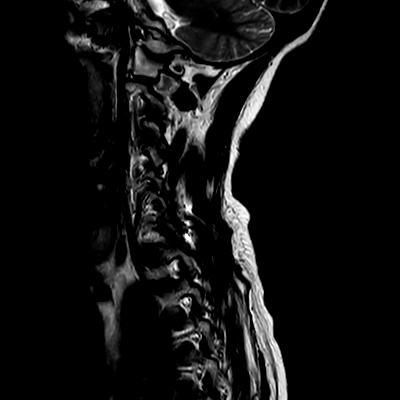
[im 7/17]
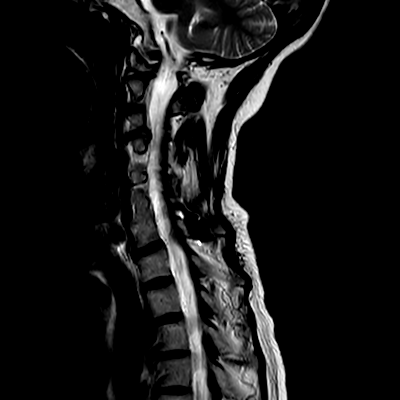
[im 10/17]
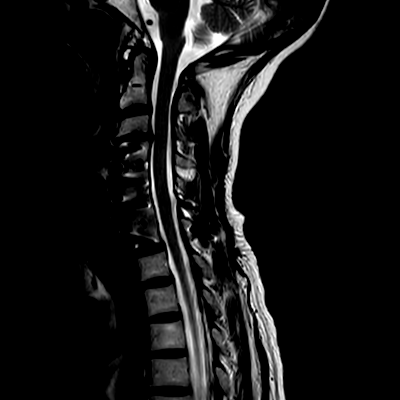
[im 12/17]
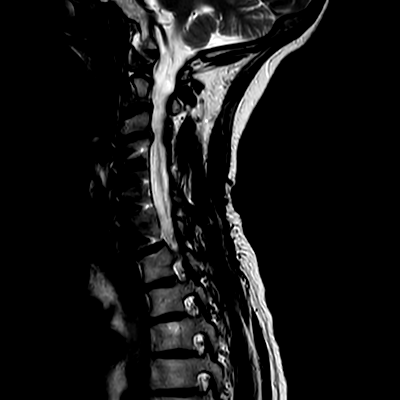
[im 14/17]
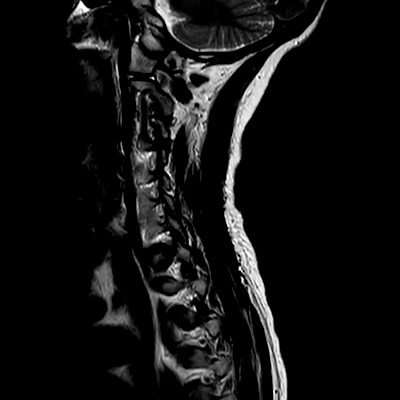
[im 17/17]
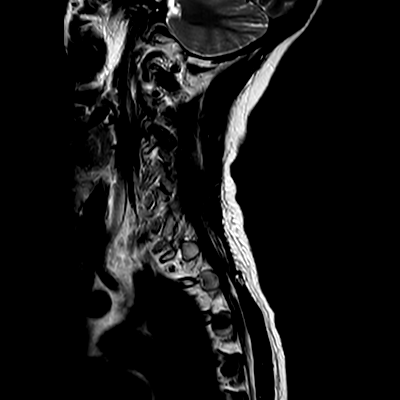

[Series 401: T1 · sagittal · 3.0mm · 0.66mm/px · 8 of 17 slices shown]
[im 1/17]
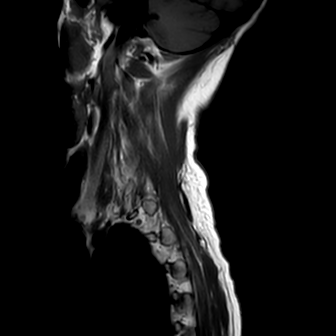
[im 3/17]
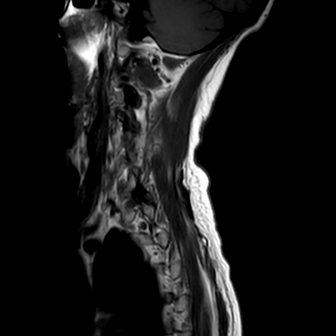
[im 5/17]
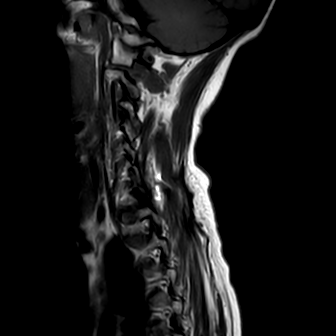
[im 7/17]
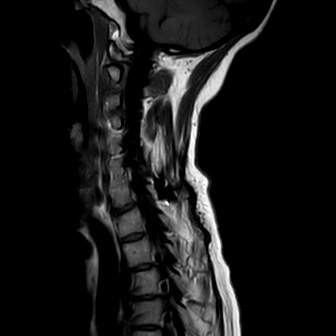
[im 10/17]
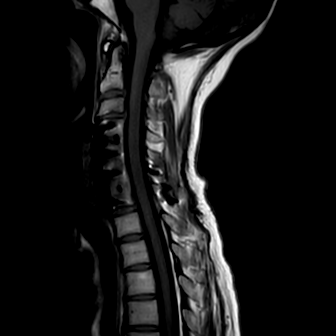
[im 12/17]
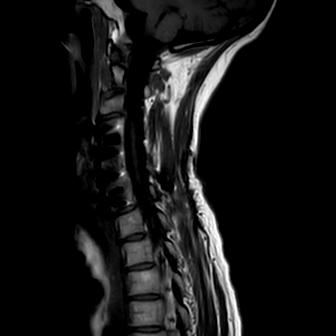
[im 14/17]
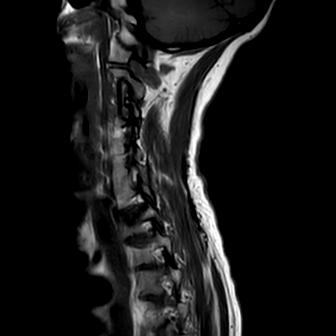
[im 17/17]
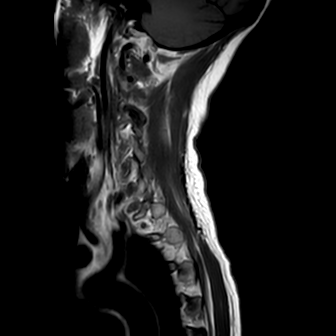

[Series 501: STIR · sagittal · 3.0mm · 0.77mm/px · 8 of 17 slices shown]
[im 1/17]
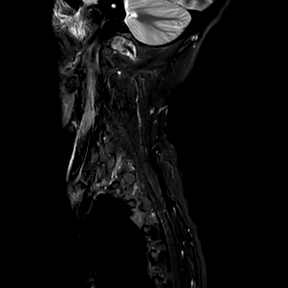
[im 3/17]
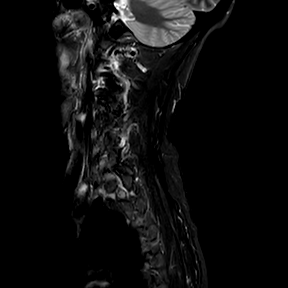
[im 5/17]
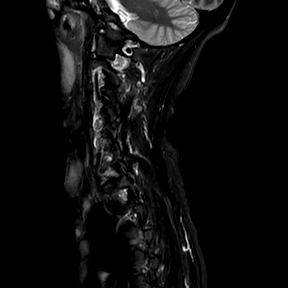
[im 7/17]
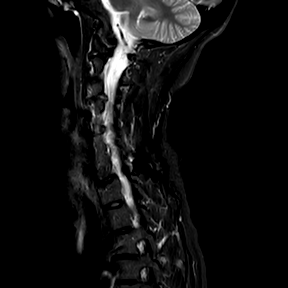
[im 10/17]
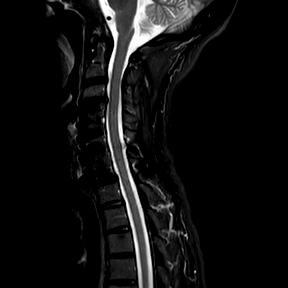
[im 12/17]
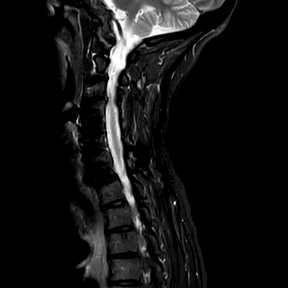
[im 14/17]
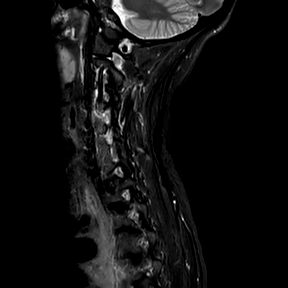
[im 17/17]
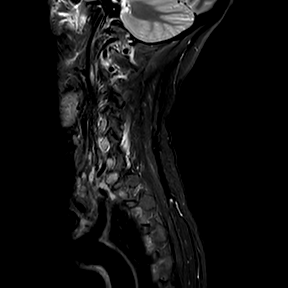

[Series 601: T2 · axial · 3.5mm · 0.48mm/px · z∈[-244,-111]mm · 10 of 34 slices shown (2 of 2)]
[im 3/34]
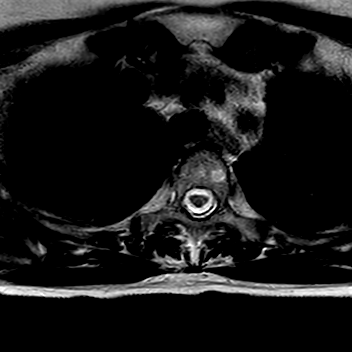
[im 5/34]
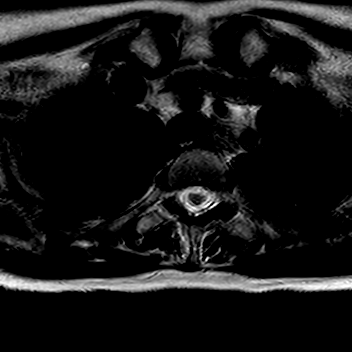
[im 7/34]
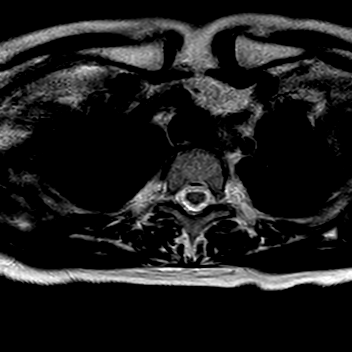
[im 12/34]
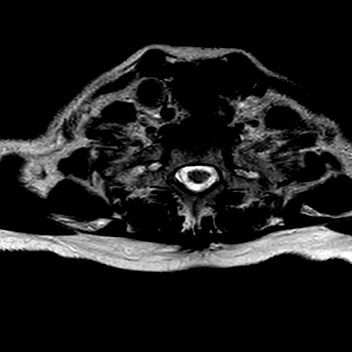
[im 16/34]
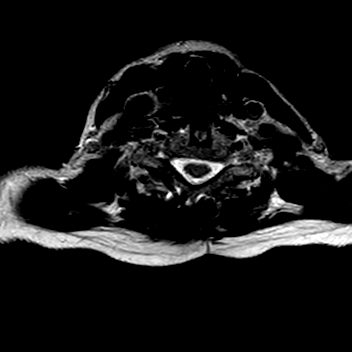
[im 18/34]
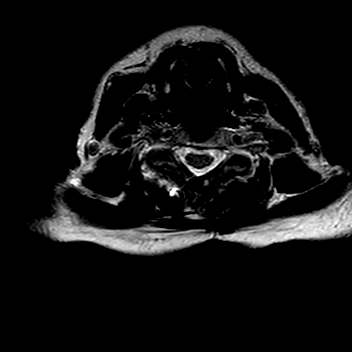
[im 20/34]
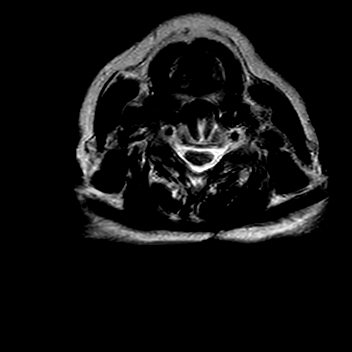
[im 25/34]
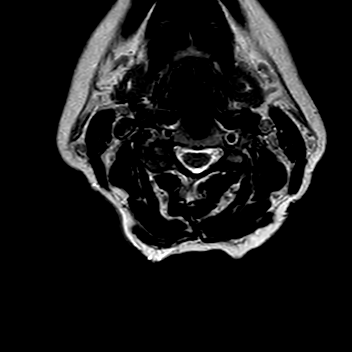
[im 29/34]
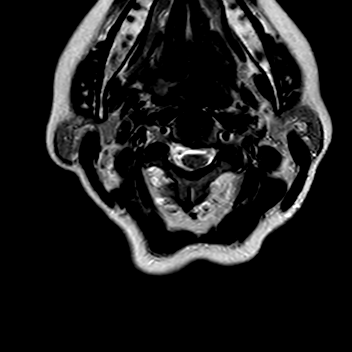
[im 34/34]
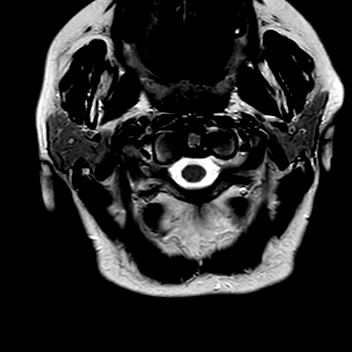

[40 of 48 positions shown; findings below may reference images not displayed]

FINDINGS: There straightening of the normal lordotic curve. Trace grade 1 anterior spondylolisthesis is seen
at C2-3, C3-4 and C7-T1. The remaining static alignment is anatomic.
The vertebral body heights are well-maintained. The bone marrow signal is normal. No evidence of
fracture.
Expected postsurgical changes of anterior cervical fusion and discectomy are seen from C4 to C7.
Degenerative changes are seen with disc height loss and mixed low T2 signal most prominent at C3-4.
The visualized brain and spinal cord demonstrate a normal intrinsic signal morphology. The
paravertebral and surrounding soft tissues unremarkable
C2-3: Uncovertebral and facet arthropathy is demonstrated resulting in moderate left foraminal
stenosis
C3-4: Uncovertebral and facet arthropathy with posterior bulging annulus and possible small central
protrusion is demonstrated resulting moderate severe right foraminal, moderate left foraminal
stenosis and minimal spinal canal narrowing
C4-5: Postsurgical changes with uncovertebral and facet arthropathy is identified without
significant stenosis
C5-6: Postsurgical changes with uncovertebral and facet arthropathy identified without significant
stenosis
C6-7: Postsurgical changes with uncovertebral facet arthropathy identified without significant
stenosis
C7-T1: Bulging annulus and facet arthropathy identified without significant stenosis
IMPRESSION: 1. Degenerative disc changes as detailed above most prominent at C3-4.
2. Postsurgical changes.
3. No acute osseous or ligamentous abnormalities.

## 2022-02-18 IMAGING — MR MR lumbar spine wo con
4 of 5 series · 29 of 48 positions shown · non-contrast
Comparison: 09 September, 2021.

SPLUMBWO
REASON FOR EXAM: Low back pain
TECHNIQUE: Multiplanar, multisequence MRI of the lumbar spine was obtained without contrast

[Series 201: T1 · sagittal · 3.0mm · 0.65mm/px · 8 of 20 slices shown]
[im 1/20]
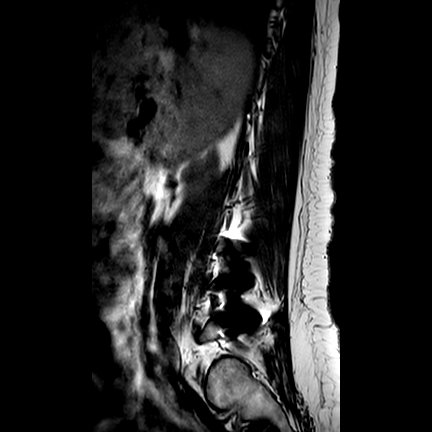
[im 3/20]
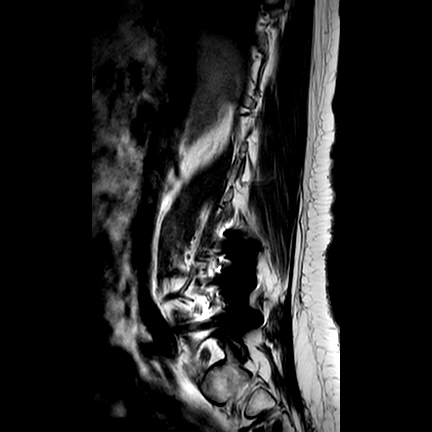
[im 6/20]
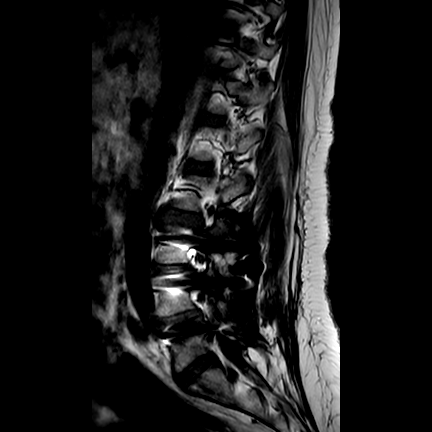
[im 9/20]
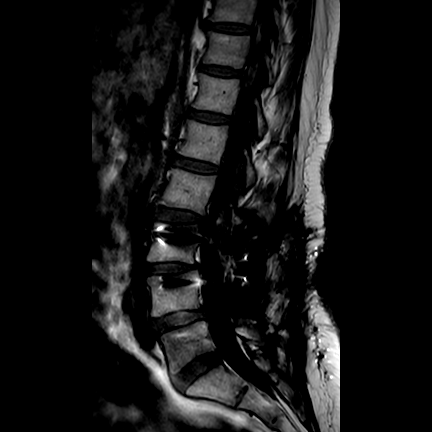
[im 11/20]
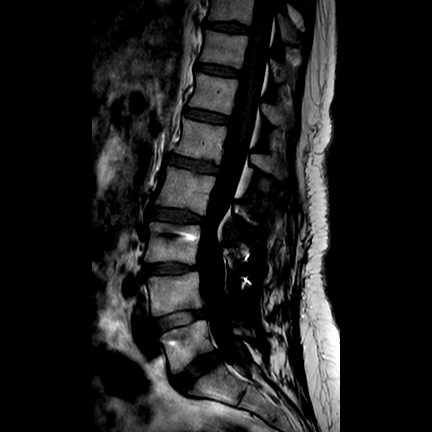
[im 14/20]
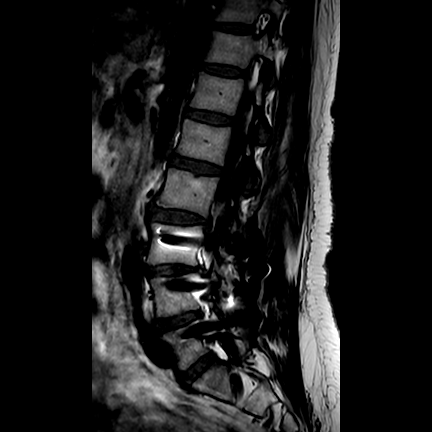
[im 17/20]
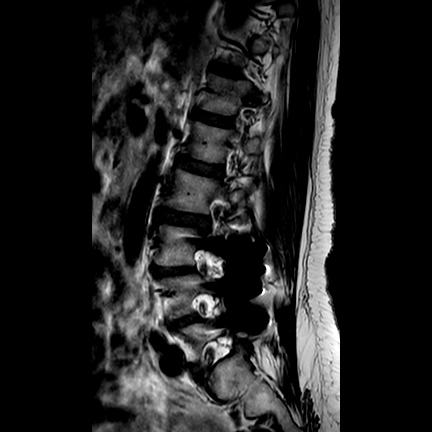
[im 20/20]
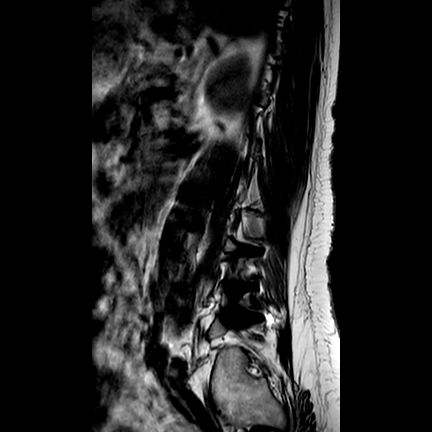

[Series 301: T2 · sagittal · 3.0mm · 0.62mm/px · 8 of 20 slices shown (1 of 2)]
[im 1/20]
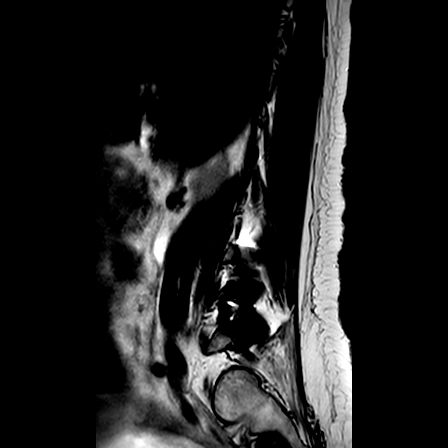
[im 3/20]
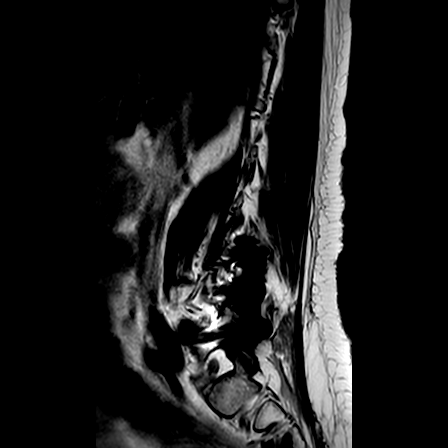
[im 6/20]
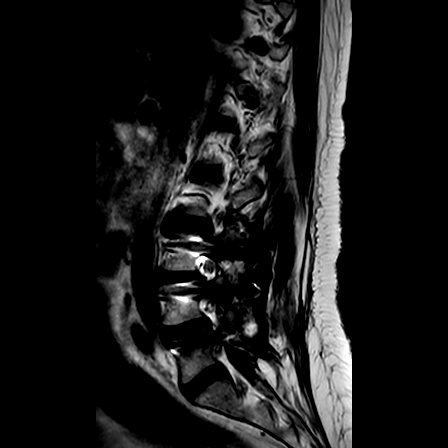
[im 9/20]
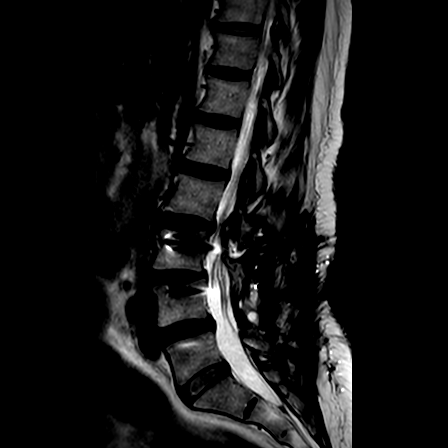
[im 11/20]
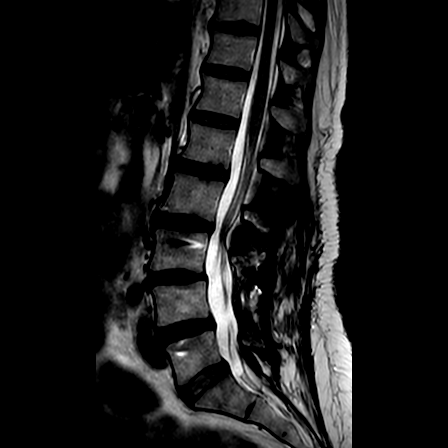
[im 14/20]
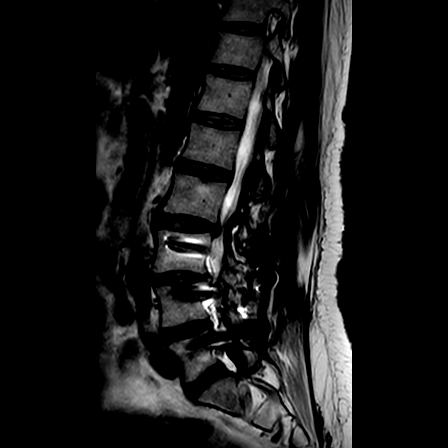
[im 17/20]
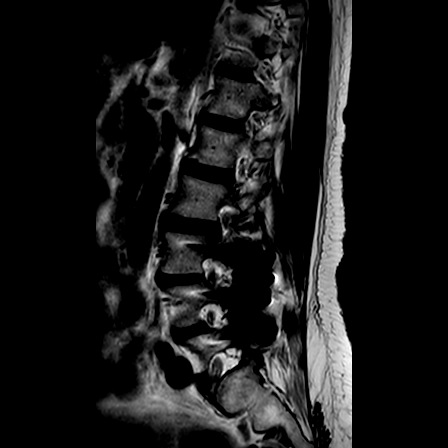
[im 20/20]
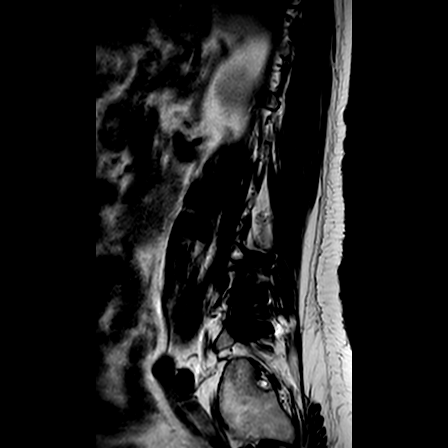

[Series 401: STIR · sagittal · 3.0mm · 0.73mm/px · 4 of 20 slices shown]
[im 1/20]
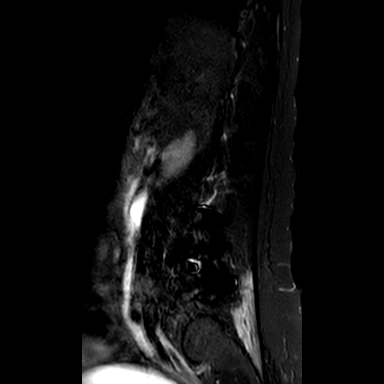
[im 3/20]
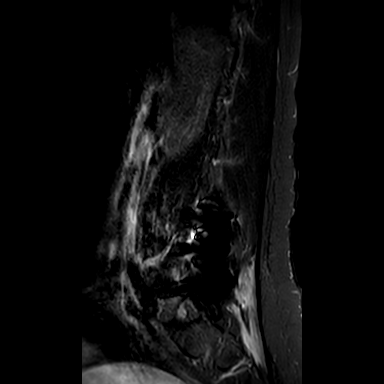
[im 11/20]
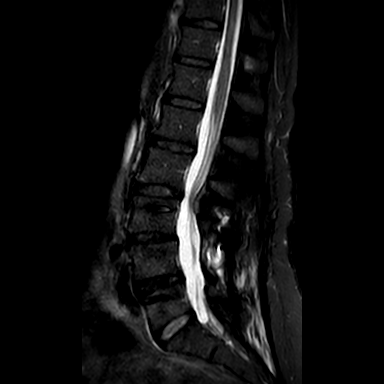
[im 17/20]
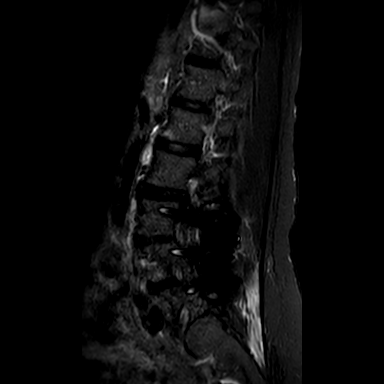

[Series 501: T2 · axial · 3.5mm · 0.57mm/px · z∈[-61,+114]mm · 9 of 45 slices shown (2 of 2)]
[im 3/45]
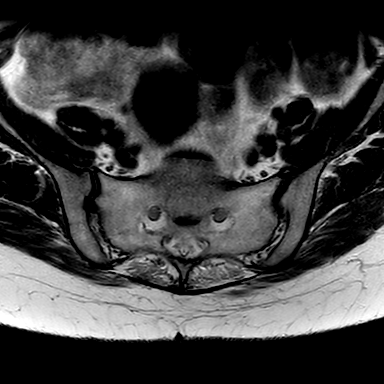
[im 8/45]
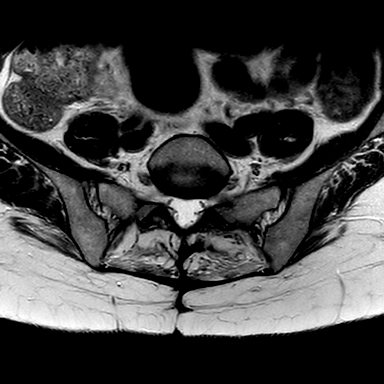
[im 13/45]
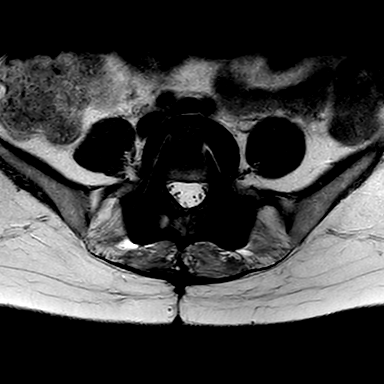
[im 20/45]
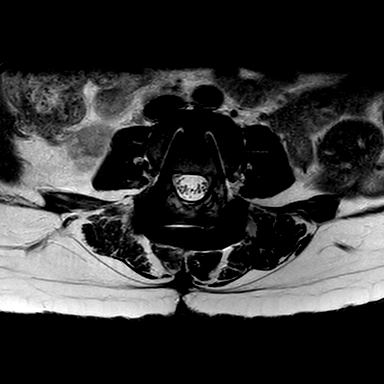
[im 23/45]
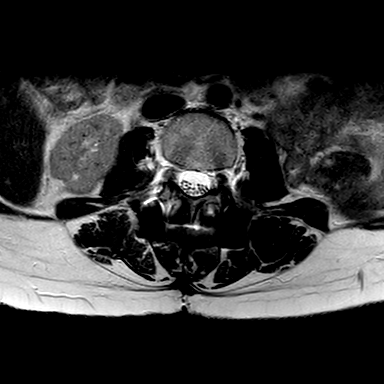
[im 25/45]
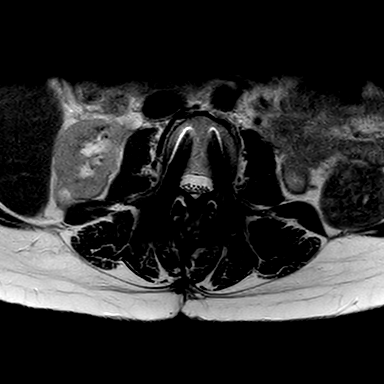
[im 32/45]
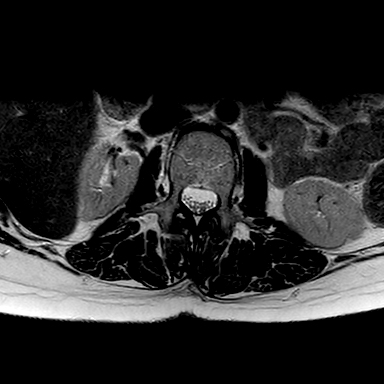
[im 37/45]
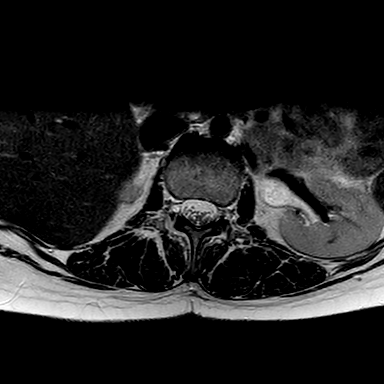
[im 42/45]
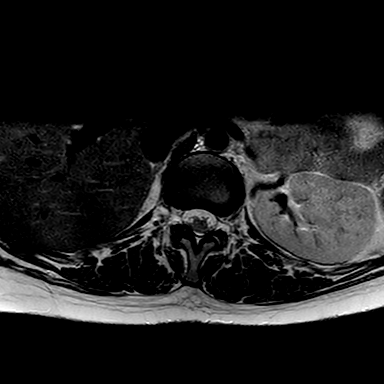

[29 of 48 positions shown; findings below may reference images not displayed]

FINDINGS: For the purposes of this exam last well-formed displaced and L5-S1.
A minimal moderate leftward convex curvature seen centered at L3. Trace grade 1 anterior
spondylolisthesis is seen at L3-4.
The vertebral body heights are well-maintained. Bone marrow signal is normal. No evidence of
fracture.
Expected postsurgical changes of decompression laminectomies and posterior fusion are seen from L3
to L5. Degenerative changes are seen with disc height loss and mixed low T2 signal most prominent at
L3-4 and L4-5.
The visualized spinal cord, conus medullaris and cauda equina demonstrate a normal intrinsic signal
morphology. The conus terminates at approximately L1-2. The paravertebral and surrounding soft
tissues unremarkable.
L1-2: No significant stenosis
L2-3: Bulging annulus with facet arthropathy and ligament flavum laxity is identified resulting in
minimal moderate bilateral foraminal spinal canal narrowing
L3-4: Postsurgical changes with facet arthropathy identified without significant stenosis
L4-5: Postsurgical changes with facet arthropathy identified resulting in minimal bilateral
foraminal narrowing
L5-S1: Bulging annulus and facet arthropathy identified without significant stenosis
IMPRESSION: 1. Minimal degenerative changes as detailed above most prominent at L2-3 the level above the fusion.
Stable from prior exam.
2. No acute osseous or ligamentous abnormalities.

## 2022-04-19 IMAGING — MG SCRNING DIGITAL BRST TOMOSYNTHESIS BILAT
6 of 10 series · 6 of 26 positions shown · non-contrast
Comparison: 03/29/2021, 01/17/2020, 08/02/2019
Digital 3D CC and MLO views were obtained.

[id number]
PROCEDURE: [id number]
REASON FOR EXAM: Screening Mammogram

[L MLO synth-2D]
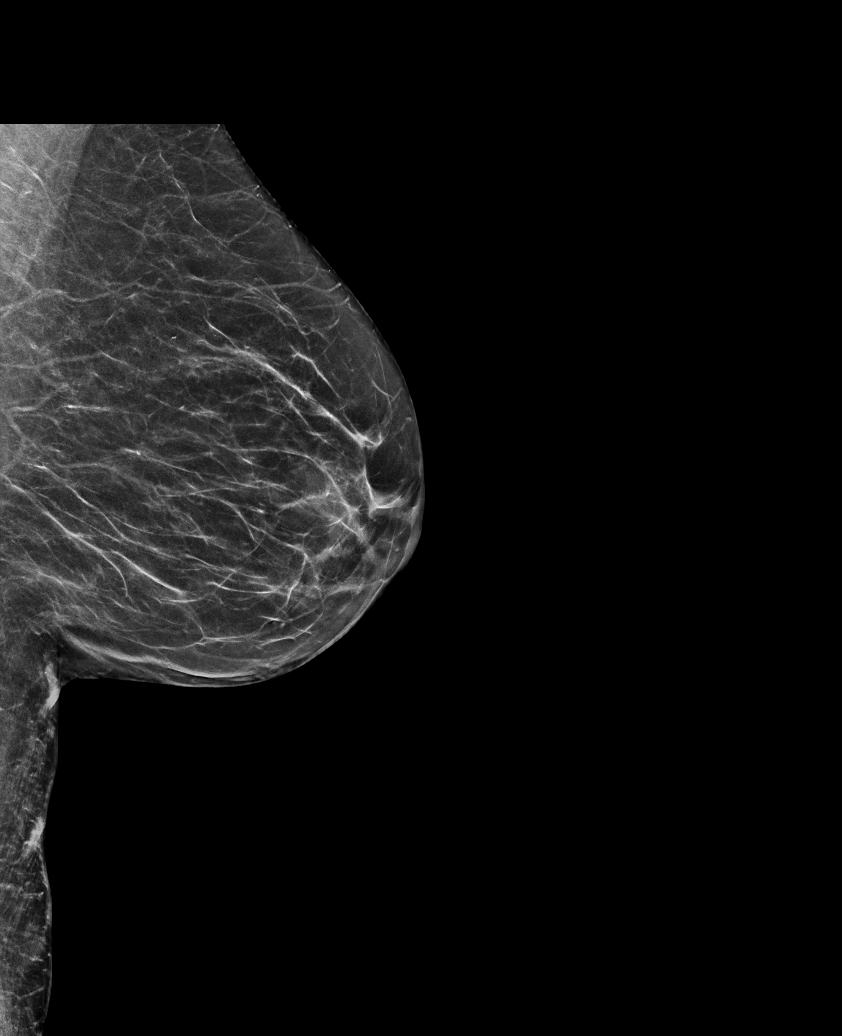

[R CC]
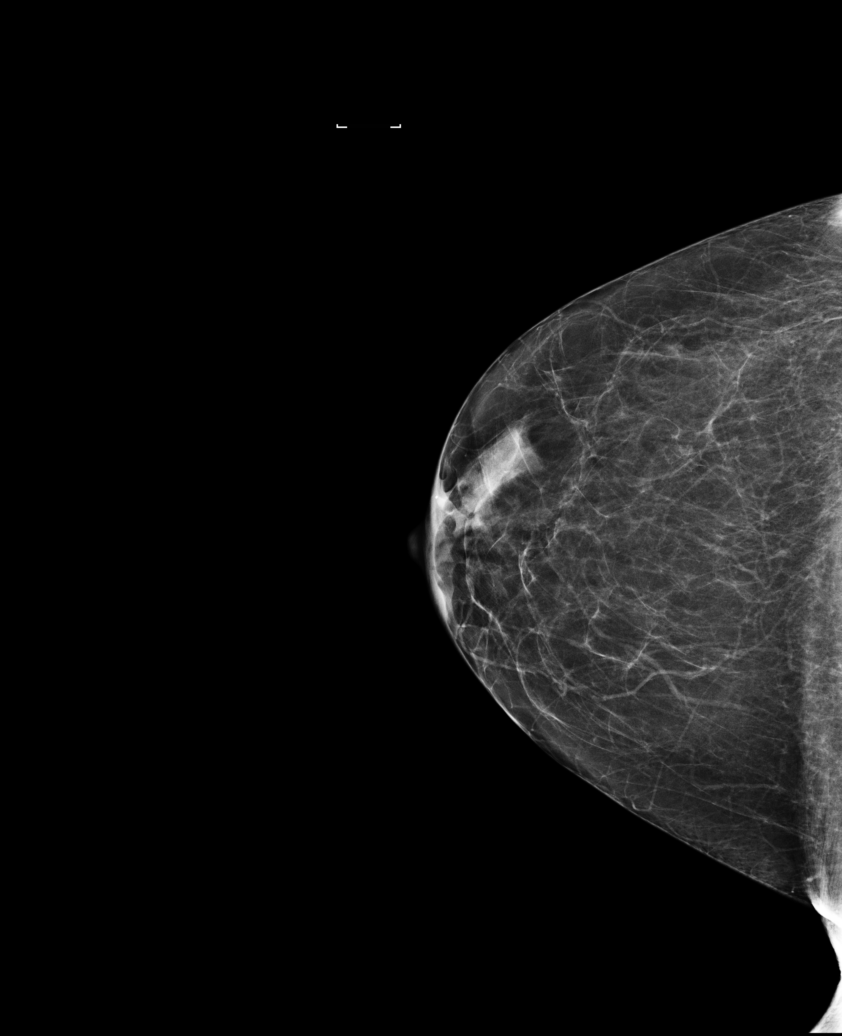

[L CC]
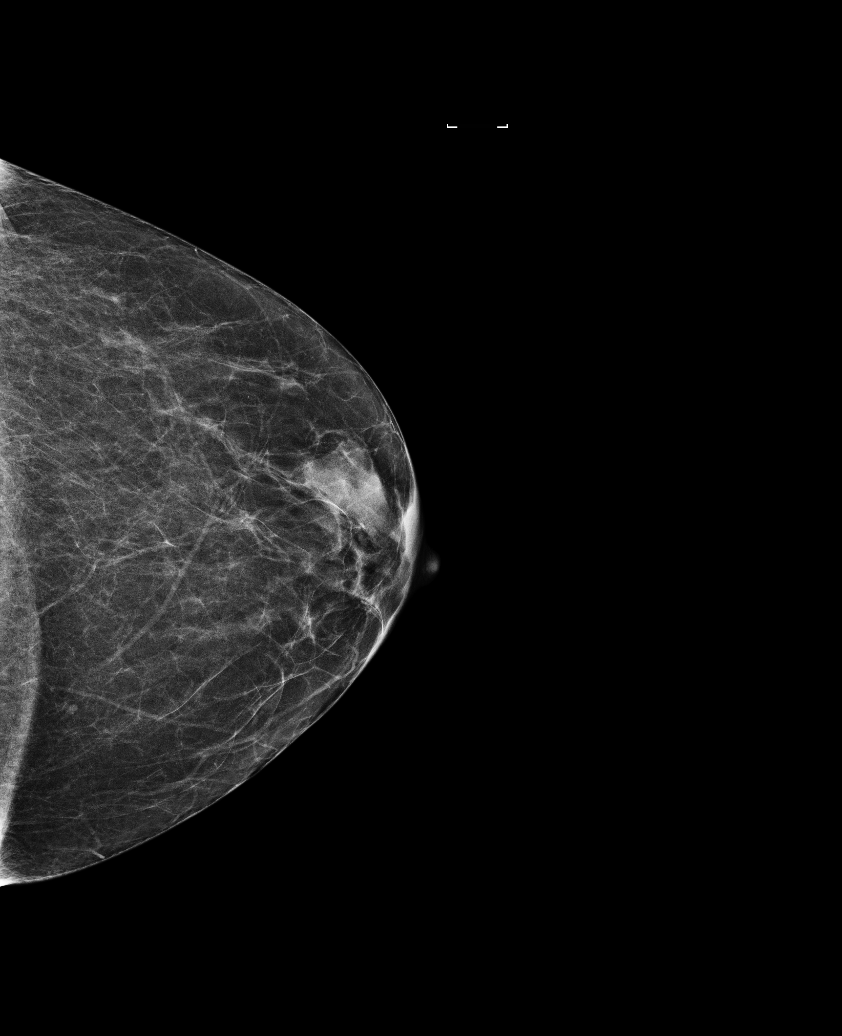

[R MLO synth-2D]
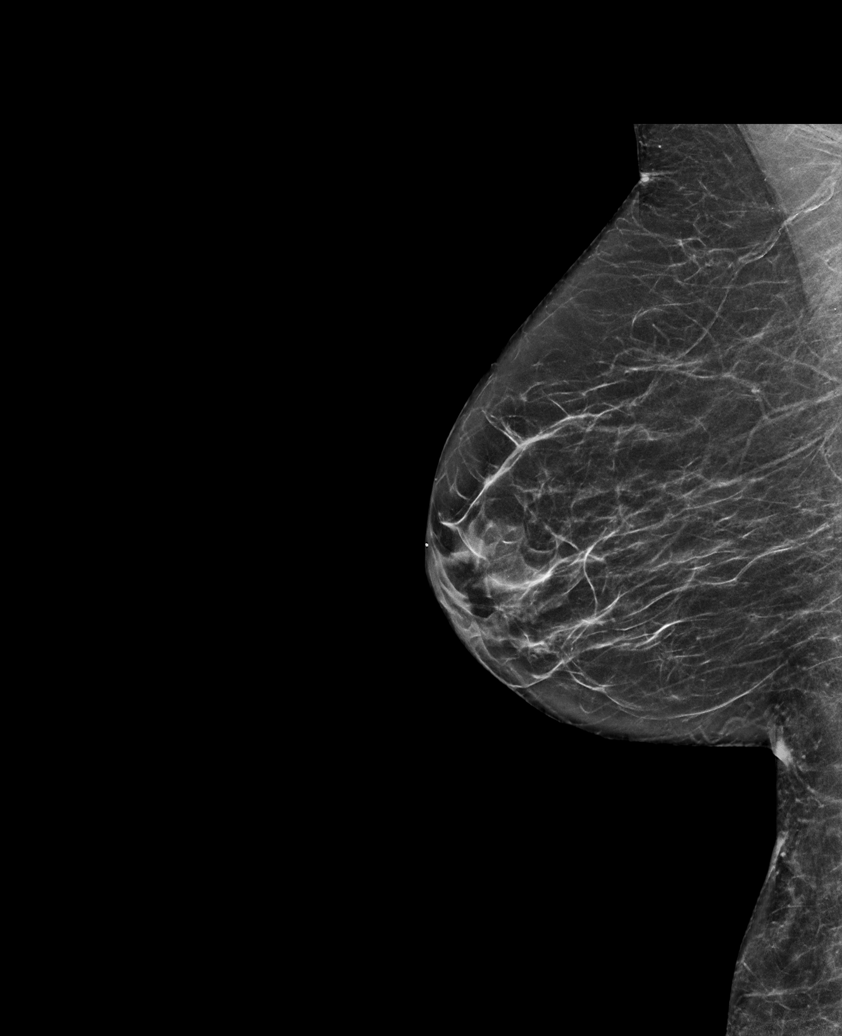

[R CC synth-2D]
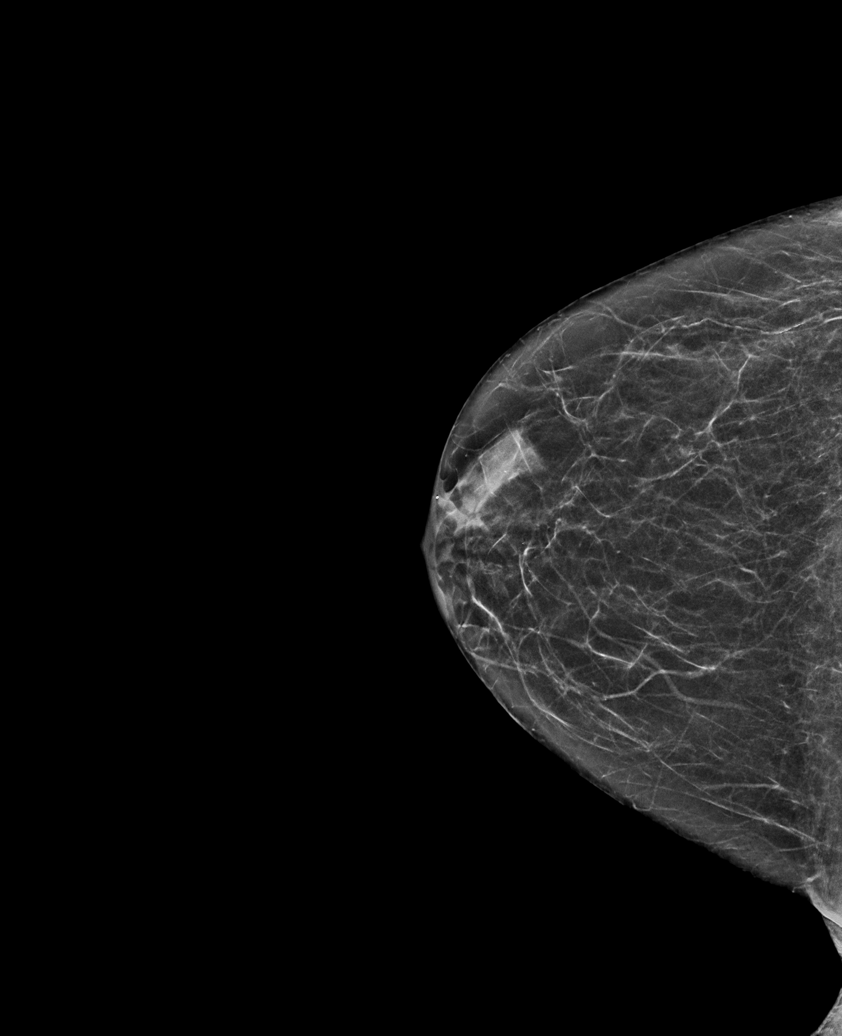

[L CC synth-2D]
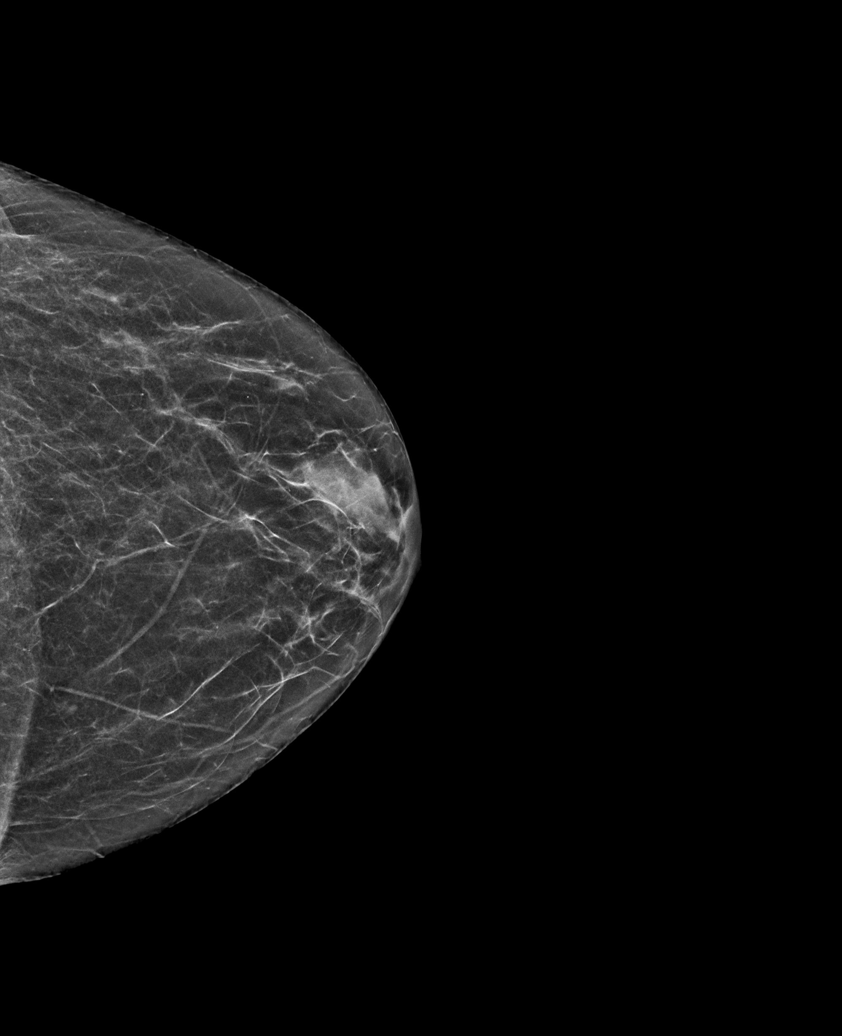

[6 of 26 positions shown; findings below may reference images not displayed]

FINDINGS: There are scattered fibroglandular densities, which could obscure a lesion on mammography.
There is no dominant mass, suspicious cluster of microcalcifications, or other evidence to indicate
malignancy.
IMPRESSION: Negative mammogram without evidence of malignancy.
Result Code: (NEG) BI-RADS 1:  Negative
Follow up: (YR) Yearly
NOTE:  Keep in mind that about 10% of breast cancers will not be identified on mammography.  Further
evaluation of a palpable mass not seen on mammography should be based on clinical grounds, including
biopsy if indicated.
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
## 2023-01-30 ENCOUNTER — Encounter: Admit: 2023-01-30 | Discharge: 2023-01-30 | Payer: MEDICARE

## 2023-10-05 ENCOUNTER — Encounter: Admit: 2023-10-05 | Discharge: 2023-10-05 | Payer: MEDICARE

## 2024-05-23 ENCOUNTER — Encounter: Admit: 2024-05-23 | Discharge: 2024-05-23 | Payer: MEDICARE

## 2024-05-24 ENCOUNTER — Encounter: Admit: 2024-05-24 | Discharge: 2024-05-24 | Payer: MEDICARE

## 2024-05-24 NOTE — Telephone Encounter
 05/24/24- Referral and records received from [facility]) have been scanned to chart and available in the Media tab.    Thank you,  Fontaine Kossman W  HIM Specialist - Cardiovascular Medicine  [facility]  [address], [address]  [place], [place]  [address]  [phone]

## 2024-05-28 ENCOUNTER — Encounter: Admit: 2024-05-28 | Discharge: 2024-05-28 | Payer: MEDICARE

## 2024-05-28 NOTE — Progress Notes
 Referral received from CVM scheduling 9/29. Additional records needed and requested earlier today. Unable to obtain cardiac testing. Contacted Adam Galindo's office and spoke to the nurse. She states the patient called yesterday and will be getting an opinion at [facility] so our referral can be closed. Referral closed.
